# Patient Record
Sex: Male | Born: 1970 | Race: Black or African American | Marital: Married | Smoking: Former smoker
Health system: Southern US, Community
[De-identification: ages and names within clinical notes are randomized; demographics above are authoritative.]

## PROBLEM LIST (undated history)

## (undated) DIAGNOSIS — K8020 Calculus of gallbladder without cholecystitis without obstruction: Secondary | ICD-10-CM

## (undated) HISTORY — PX: OTHER SURGICAL HISTORY: SHX169

## (undated) HISTORY — DX: Calculus of gallbladder without cholecystitis without obstruction: K80.20

---

## 2012-03-22 ENCOUNTER — Emergency Department (HOSPITAL_COMMUNITY): Payer: 59

## 2012-03-22 ENCOUNTER — Encounter (HOSPITAL_COMMUNITY): Payer: Self-pay | Admitting: *Deleted

## 2012-03-22 ENCOUNTER — Emergency Department (HOSPITAL_COMMUNITY)
Admission: EM | Admit: 2012-03-22 | Discharge: 2012-03-22 | Disposition: A | Discharge status: Home / Self Care | Payer: 59 | Attending: Emergency Medicine | Admitting: Emergency Medicine

## 2012-03-22 DIAGNOSIS — R079 Chest pain, unspecified: Secondary | ICD-10-CM | POA: Insufficient documentation

## 2012-03-22 DIAGNOSIS — R05 Cough: Secondary | ICD-10-CM | POA: Insufficient documentation

## 2012-03-22 DIAGNOSIS — H6090 Unspecified otitis externa, unspecified ear: Secondary | ICD-10-CM

## 2012-03-22 DIAGNOSIS — R059 Cough, unspecified: Secondary | ICD-10-CM | POA: Insufficient documentation

## 2012-03-22 DIAGNOSIS — F17200 Nicotine dependence, unspecified, uncomplicated: Secondary | ICD-10-CM | POA: Insufficient documentation

## 2012-03-22 DIAGNOSIS — R0789 Other chest pain: Secondary | ICD-10-CM

## 2012-03-22 DIAGNOSIS — Z720 Tobacco use: Secondary | ICD-10-CM

## 2012-03-22 DIAGNOSIS — J069 Acute upper respiratory infection, unspecified: Secondary | ICD-10-CM

## 2012-03-22 DIAGNOSIS — H60509 Unspecified acute noninfective otitis externa, unspecified ear: Secondary | ICD-10-CM | POA: Insufficient documentation

## 2012-03-22 MED ORDER — OFLOXACIN 0.3 % OT SOLN
5.0000 [drp] | Freq: Two times a day (BID) | OTIC | Status: DC
  Filled 2012-03-22: qty 5

## 2012-03-22 MED ORDER — ALBUTEROL SULFATE (5 MG/ML) 0.5% IN NEBU
5.0000 mg | INHALATION_SOLUTION | RESPIRATORY_TRACT | Status: DC
  Administered 2012-03-22: 5 mg via RESPIRATORY_TRACT
  Filled 2012-03-22: qty 1

## 2012-03-22 MED ORDER — IPRATROPIUM BROMIDE 0.02 % IN SOLN
0.5000 mg | RESPIRATORY_TRACT | Status: DC
  Administered 2012-03-22: 0.5 mg via RESPIRATORY_TRACT
  Filled 2012-03-22: qty 2.5

## 2012-03-22 MED ORDER — OFLOXACIN 0.3 % OP SOLN
5.0000 [drp] | Freq: Two times a day (BID) | OPHTHALMIC | Status: DC
  Administered 2012-03-22: 5 [drp] via OTIC
  Filled 2012-03-22: qty 5

## 2012-03-22 NOTE — ED Notes (Signed)
Pt given d/c teaching and ear drops before d/c. Pt has no further questions upon d/c. Pt ambulatory leaving ED by self. Pt does not appear to be in any acute distress upon d/c.

## 2012-03-22 NOTE — ED Provider Notes (Signed)
History   This chart was scribed for Curtis Skene, MD by [name], ED Scribe. This patient was seen in room TR11C/TR11C and the patient's care was started at 9:52 PM    CSN: [id number]  Arrival date & time 03/22/12  2048   First MD Initiated Contact with Patient 03/22/12 2139      Chief Complaint  Patient presents with  . Cough  . Nasal Congestion     The history is provided by the patient. No language interpreter was used.   Curtis Santiago is a 41 y.o. male who presents to the Emergency Department complaining of constant congestion for past 5 days with associated right side otalgia beginning yesterday and fever.  Pt also reports sharp, stabbing, non-radiating right-side rib pain brought on by, and worsened by, constant non-productive coughs over past 5 days.  Pt denies rash, myalgias, back pain, abdominal pain, nausea, emesis, or urinary symptoms.  Pt is a current everyday smoker but denies alcohol use.   History reviewed. No pertinent past medical history.  History reviewed. No pertinent past surgical history.  History reviewed. No pertinent family history.  History  Substance Use Topics  . Smoking status: Never Smoker   . Smokeless tobacco: Not on file  . Alcohol Use: No      Review of Systems At least 10pt or greater review of systems completed and are negative except where specified in the HPI.  Allergies  Review of patient's allergies indicates no known allergies.  Home Medications  No current outpatient prescriptions on file.  BP 137/80  Pulse 118  Temp 99.7 F (37.6 C) (Oral)  Resp 16  SpO2 97%  Physical Exam  Nursing notes reviewed.  Electronic medical record reviewed. VITAL SIGNS:   Filed Vitals:   03/22/12 2100 03/22/12 2256  BP: 137/80 121/66  Pulse: 118 111  Temp: 99.7 F (37.6 C) 99.2 F (37.3 C)  TempSrc: Oral Oral  Resp: 16 20  SpO2: 97% 96%   CONSTITUTIONAL: Awake, oriented, appears non-toxic HENT: Atraumatic, normocephalic,  oral mucosa pink and moist, airway patent. Nares patent without drainage. External ears normal. Right ear canal inflamed with mild swelling EYES: Conjunctiva clear, EOMI, PERRLA NECK: Trachea midline, non-tender, supple CARDIOVASCULAR: Normal heart rate, Normal rhythm, No murmurs, rubs, gallops PULMONARY/CHEST: Clear to auscultation, no rhonchi, wheezes, or rales. Symmetrical breath sounds. Right sided ribs TTP, worst close to costal angle in the mid-clavicular line ABDOMINAL: Non-distended, soft, non-tender - no rebound or guarding.  BS normal. NEUROLOGIC: Non-focal, moving all four extremities, no gross sensory or motor deficits. EXTREMITIES: No clubbing, cyanosis, or edema SKIN: Warm, Dry, No erythema, No rash  ED Course  Procedures (including critical care time) DIAGNOSTIC STUDIES: Oxygen Saturation is 97% on room air, adequate by my interpretation.    COORDINATION OF CARE: 9:57 PM- Patient informed of clinical course, understands medical decision-making process, and agrees with plan.  Labs Reviewed - No data to display Dg Chest 2 View  03/22/2012  *RADIOLOGY REPORT*  Clinical Data: Nasal congestion  CHEST - 2 VIEW  Comparison: None  Findings: The heart size and mediastinal contours are within normal limits.  Both lungs are clear.  The visualized skeletal structures are unremarkable.  IMPRESSION: No active cardiopulmonary abnormalities.   Original Report Authenticated By: Signa Kell, M.D.      1. Otitis externa   2. URI (upper respiratory infection)   3. Cough   4. Chest wall pain   5. Tobacco use  MDM  Curtis Santiago is a 41 y.o. male presenting with h/o URI, cough, ear pain who continues to smoke.  Pt has chest wall pain secondary to coughing.  Do not think his CP represents any intrathoracic emergency - BS heard BL, don't suspect PTX, ACS, PE.  CXR obtained per protocol - negative. Testing not indicated.  Mild case of otitis externa.  I explained the diagnosis and  have given explicit precautions to return to the ER including any other new or worsening symptoms. The patient understands and accepts the medical plan as it's been dictated and I have answered their questions. Discharge instructions concerning home care and prescriptions have been given.  The patient is STABLE and is discharged to home in good condition.    I personally performed the services described in this documentation, which was scribed in my presence. The recorded information has been reviewed and is accurate.        Curtis Skene, MD 03/24/12 1831

## 2012-03-22 NOTE — ED Notes (Signed)
Pt reports congestions since Thanksgiving.  States that his (R) ear started to hurt yesterday.  Pt reports cough that is nonproductive.  A/O x 4.  NAD.

## 2012-03-22 NOTE — ED Notes (Signed)
Dr. Bonk at bedside 

## 2012-03-22 NOTE — ED Notes (Signed)
Messaged pharmacy for medication to be sent to [facility].

## 2012-03-22 NOTE — ED Notes (Signed)
Pt denies feeling dizzy or nausea; pt denies shortness of breath; pt mentating appropriately.

## 2014-07-05 IMAGING — CR DG CHEST 2V
2 series · 2 of 2 positions shown · non-contrast
Comparison: None

CLINICAL DATA: Nasal congestion

CHEST - 2 VIEW

[w chest pa]
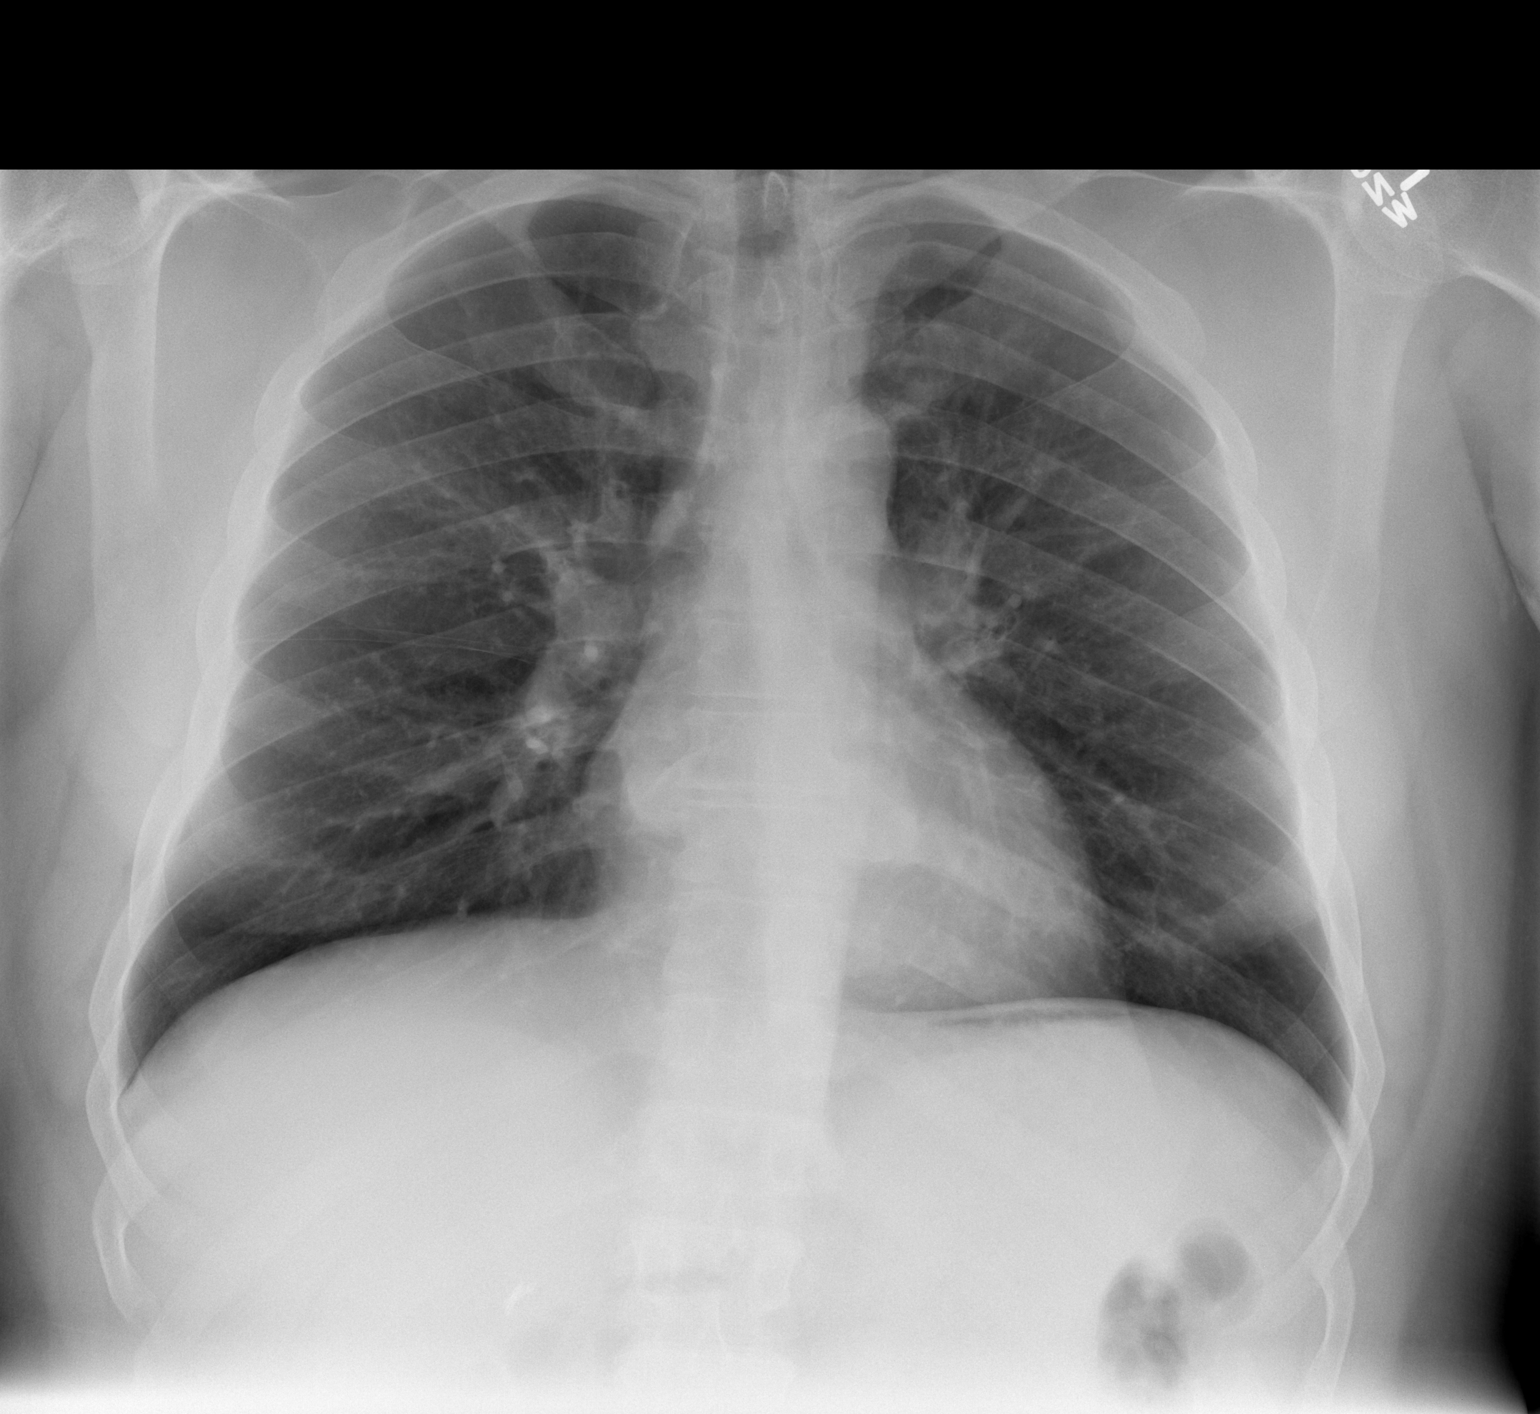

[w chest lat]
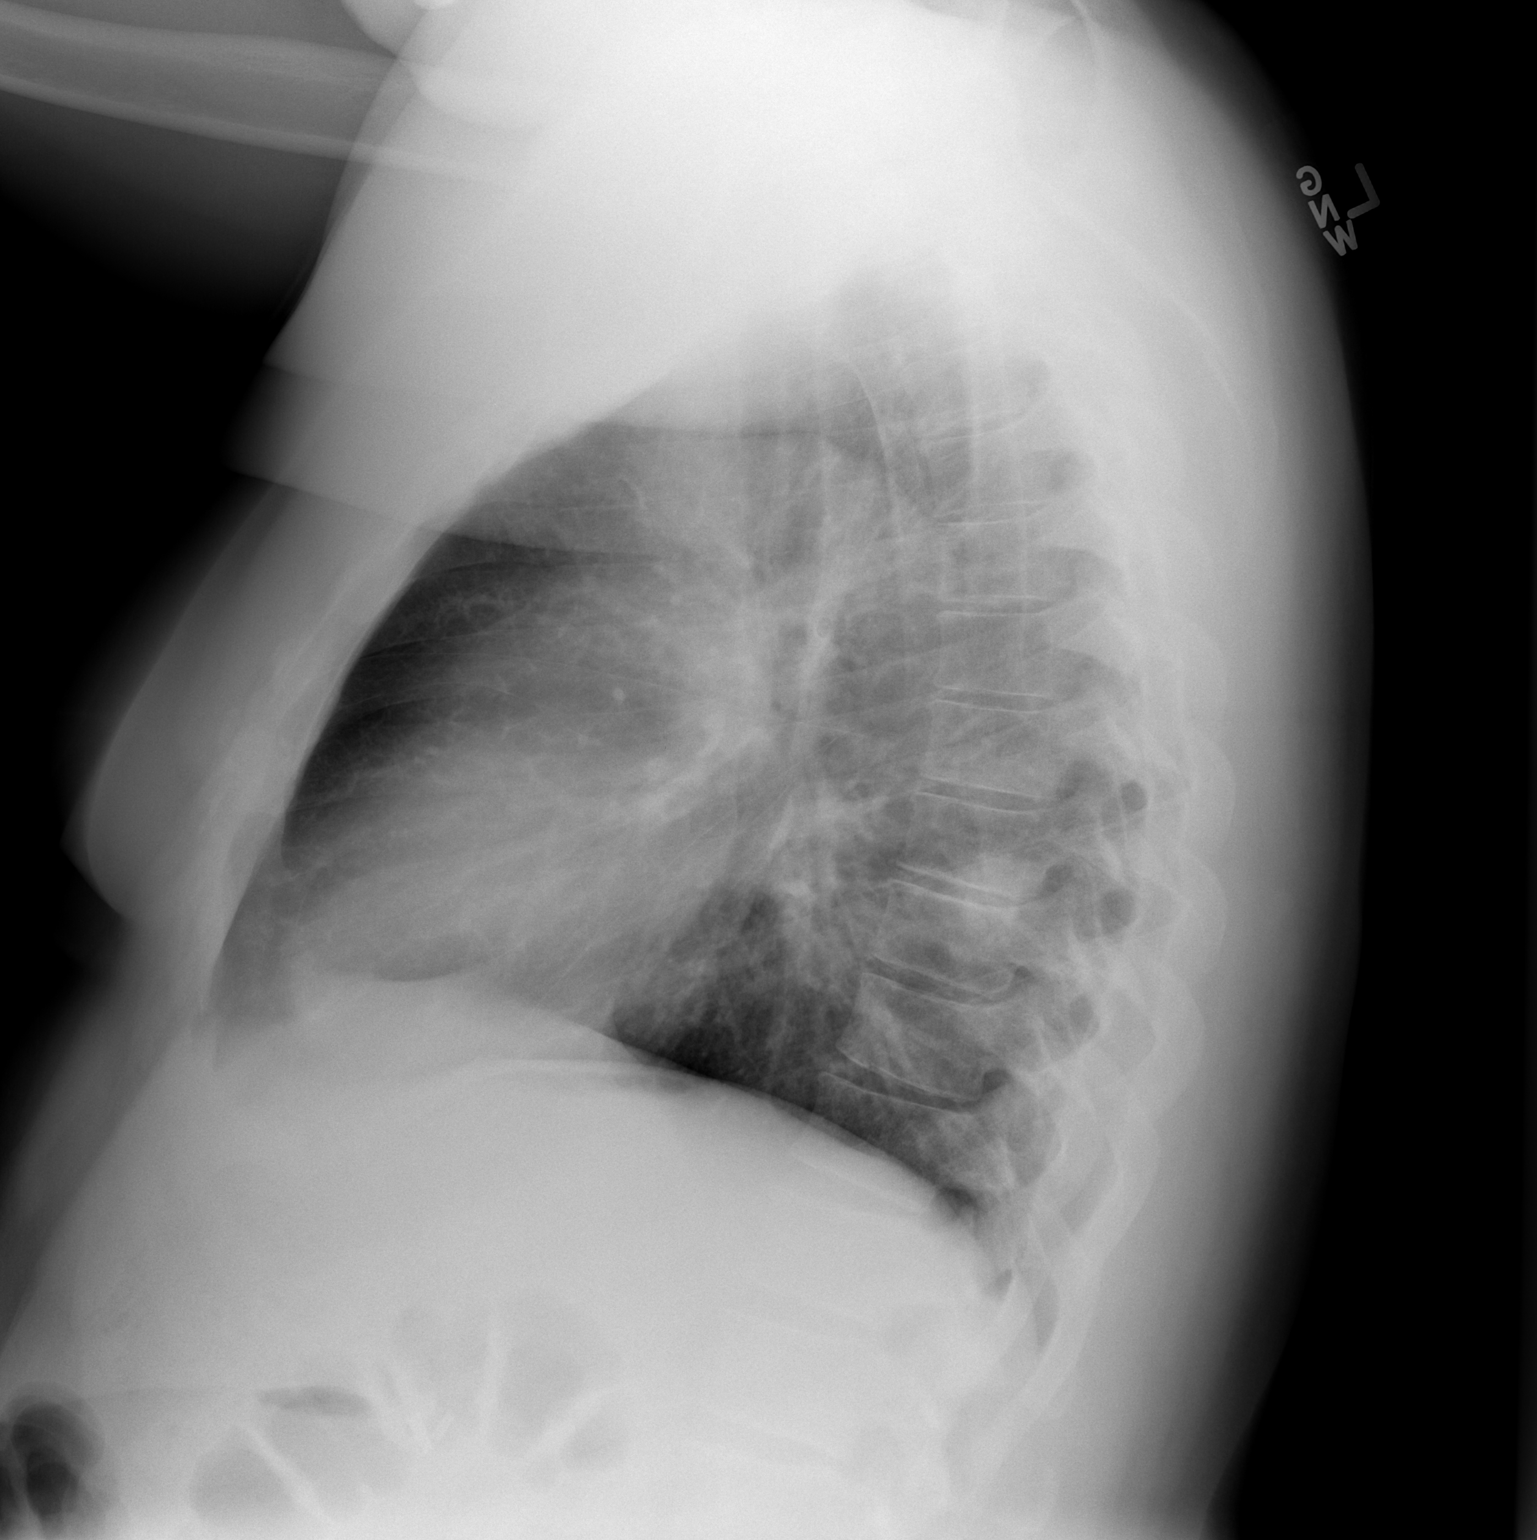

[2 of 2 positions shown; findings below may reference images not displayed]

FINDINGS: The heart size and mediastinal contours are within normal
limits.  Both lungs are clear.  The visualized skeletal structures
are unremarkable.
IMPRESSION: No active cardiopulmonary abnormalities.

## 2022-05-12 ENCOUNTER — Emergency Department (HOSPITAL_BASED_OUTPATIENT_CLINIC_OR_DEPARTMENT_OTHER)
Admission: EM | Admit: 2022-05-12 | Discharge: 2022-05-12 | Disposition: A | Discharge status: Home / Self Care | Payer: Self-pay | Attending: Emergency Medicine | Admitting: Emergency Medicine

## 2022-05-12 ENCOUNTER — Emergency Department (HOSPITAL_BASED_OUTPATIENT_CLINIC_OR_DEPARTMENT_OTHER): Payer: Self-pay

## 2022-05-12 ENCOUNTER — Other Ambulatory Visit: Payer: Self-pay

## 2022-05-12 ENCOUNTER — Encounter (HOSPITAL_BASED_OUTPATIENT_CLINIC_OR_DEPARTMENT_OTHER): Payer: Self-pay

## 2022-05-12 DIAGNOSIS — R112 Nausea with vomiting, unspecified: Secondary | ICD-10-CM | POA: Insufficient documentation

## 2022-05-12 DIAGNOSIS — Z20822 Contact with and (suspected) exposure to covid-19: Secondary | ICD-10-CM | POA: Insufficient documentation

## 2022-05-12 DIAGNOSIS — R519 Headache, unspecified: Secondary | ICD-10-CM | POA: Insufficient documentation

## 2022-05-12 LAB — COMPREHENSIVE METABOLIC PANEL
ALT: 19 U/L (ref 0–44)
AST: 15 U/L (ref 15–41)
Albumin: 4.1 g/dL (ref 3.5–5.0)
Alkaline Phosphatase: 90 U/L (ref 38–126)
Anion gap: 7 (ref 5–15)
BUN: 10 mg/dL (ref 6–20)
CO2: 30 mmol/L (ref 22–32)
Calcium: 9.5 mg/dL (ref 8.9–10.3)
Chloride: 97 mmol/L — ABNORMAL LOW (ref 98–111)
Creatinine, Ser: 1.01 mg/dL (ref 0.61–1.24)
GFR, Estimated: >60 mL/min (ref >60)
Glucose, Bld: 110 mg/dL — ABNORMAL HIGH (ref 70–99)
Potassium: 3.5 mmol/L (ref 3.5–5.1)
Sodium: 134 mmol/L — ABNORMAL LOW (ref 135–145)
Total Bilirubin: 0.2 mg/dL — ABNORMAL LOW (ref 0.3–1.2)
Total Protein: 8.8 g/dL — ABNORMAL HIGH (ref 6.5–8.1)

## 2022-05-12 LAB — CBC
HCT: 45 % (ref 39.0–52.0)
Hemoglobin: 14.1 g/dL (ref 13.0–17.0)
MCH: 25.5 pg — ABNORMAL LOW (ref 26.0–34.0)
MCHC: 31.3 g/dL (ref 30.0–36.0)
MCV: 81.2 fL (ref 80.0–100.0)
Platelets: 468 10*3/uL — ABNORMAL HIGH (ref 150–400)
RBC: 5.54 MIL/uL (ref 4.22–5.81)
RDW: 14.3 % (ref 11.5–15.5)
WBC: 13.3 10*3/uL — ABNORMAL HIGH (ref 4.0–10.5)
nRBC: 0 % (ref 0.0–0.2)

## 2022-05-12 LAB — URINALYSIS, ROUTINE W REFLEX MICROSCOPIC
Bilirubin Urine: NEGATIVE
Glucose, UA: NEGATIVE mg/dL
Ketones, ur: NEGATIVE mg/dL
Leukocytes,Ua: NEGATIVE
Nitrite: NEGATIVE
Protein, ur: NEGATIVE mg/dL
Specific Gravity, Urine: 1.01 (ref 1.005–1.030)
pH: 6 (ref 5.0–8.0)

## 2022-05-12 LAB — RESP PANEL BY RT-PCR (RSV, FLU A&B, COVID)  RVPGX2
Influenza A by PCR: NEGATIVE
Influenza B by PCR: NEGATIVE
Resp Syncytial Virus by PCR: NEGATIVE
SARS Coronavirus 2 by RT PCR: NEGATIVE

## 2022-05-12 LAB — PROTIME-INR
INR: 0.9 (ref 0.8–1.2)
Prothrombin Time: 11.7 seconds (ref 11.4–15.2)

## 2022-05-12 LAB — URINALYSIS, MICROSCOPIC (REFLEX)

## 2022-05-12 LAB — LIPASE, BLOOD: Lipase: 23 U/L (ref 11–51)

## 2022-05-12 MED ORDER — ACETAMINOPHEN 325 MG PO TABS
650.0000 mg | ORAL_TABLET | Freq: Once | ORAL | Status: AC
  Administered 2022-05-12: 650 mg via ORAL
  Filled 2022-05-12: qty 2

## 2022-05-12 MED ORDER — NAPROXEN 375 MG PO TABS: 375.0000 mg | ORAL_TABLET | Freq: Two times a day (BID) | ORAL | 0 refills | Status: DC

## 2022-05-12 MED ORDER — SODIUM CHLORIDE 0.9 % IV BOLUS
1000.0000 mL | Freq: Once | INTRAVENOUS | Status: AC
  Administered 2022-05-12: 1000 mL via INTRAVENOUS

## 2022-05-12 MED ORDER — IOHEXOL 300 MG/ML  SOLN
100.0000 mL | Freq: Once | INTRAMUSCULAR | Status: AC | PRN
  Administered 2022-05-12: 100 mL via INTRAVENOUS

## 2022-05-12 MED ORDER — ONDANSETRON 8 MG PO TBDP: 8.0000 mg | ORAL_TABLET | Freq: Three times a day (TID) | ORAL | 0 refills | Status: DC | PRN

## 2022-05-12 MED ORDER — ONDANSETRON HCL 4 MG/2ML IJ SOLN
4.0000 mg | Freq: Once | INTRAMUSCULAR | Status: AC
  Administered 2022-05-12: 4 mg via INTRAVENOUS
  Filled 2022-05-12: qty 2

## 2022-05-12 NOTE — ED Provider Notes (Signed)
[place] [facility] Provider Note   CSN: [id number] Arrival date & time: 05/12/22  1651     History  Chief Complaint  Patient presents with   Vomiting   Headache    Curtis Santiago is a 52 y.o. male.   Headache  Pt started having nausea and vomiting yesterday.   He thought he noticed some blood after vomiting once but did not see it afterwards.  NO diarrhea.  Tonight he started having pain in his abdomen.  He has been having headaches off and on the last month.  NO acute onset today.  No fever or cough.    Home Medications Prior to Admission medications   Medication Sig Start Date End Date Taking? Authorizing Provider  naproxen (NAPROSYN) 375 MG tablet Take 1 tablet (375 mg total) by mouth 2 (two) times daily. 05/12/22  Yes Dorie Rank, MD  ondansetron (ZOFRAN-ODT) 8 MG disintegrating tablet Take 1 tablet (8 mg total) by mouth every 8 (eight) hours as needed for nausea or vomiting. 05/12/22  Yes Dorie Rank, MD  guaiFENesin (ROBITUSSIN) 100 MG/5ML SOLN Take 5 mLs by mouth every 4 (four) hours as needed. For cough    [provider]      Allergies    Patient has no known allergies.    Review of Systems   Review of Systems  Neurological:  Positive for headaches.    Physical Exam Updated Vital Signs BP 110/68   Pulse 83   Temp 98.5 F (36.9 C) (Oral)   Resp 18   Ht 1.676 m (5\' 6" )   Wt 99.8 kg   SpO2 99%   BMI 35.51 kg/m  Physical Exam Vitals and nursing note reviewed.  Constitutional:      General: He is not in acute distress.    Appearance: He is well-developed.  HENT:     Head: Normocephalic and atraumatic.     Right Ear: External ear normal.     Left Ear: External ear normal.  Eyes:     General: No scleral icterus.       Right eye: No discharge.        Left eye: No discharge.     Conjunctiva/sclera: Conjunctivae normal.  Neck:     Trachea: No tracheal deviation.  Cardiovascular:     Rate and Rhythm:  Normal rate and regular rhythm.  Pulmonary:     Effort: Pulmonary effort is normal. No respiratory distress.     Breath sounds: Normal breath sounds. No stridor. No wheezing or rales.  Abdominal:     General: Bowel sounds are normal. There is no distension.     Palpations: Abdomen is soft.     Tenderness: There is no abdominal tenderness. There is no guarding or rebound.  Musculoskeletal:        General: No tenderness or deformity.     Cervical back: Full passive range of motion without pain and neck supple.  Skin:    General: Skin is warm and dry.     Findings: No rash.  Neurological:     General: No focal deficit present.     Mental Status: He is alert.     Cranial Nerves: No cranial nerve deficit, dysarthria or facial asymmetry.     Sensory: No sensory deficit.     Motor: No abnormal muscle tone or seizure activity.     Coordination: Coordination normal.  Psychiatric:        Mood and Affect: Mood normal.  ED Results / Procedures / Treatments   Labs (all labs ordered are listed, but only abnormal results are displayed) Labs Reviewed  COMPREHENSIVE METABOLIC PANEL - Abnormal; Notable for the following components:      Result Value   Sodium 134 (*)    Chloride 97 (*)    Glucose, Bld 110 (*)    Total Protein 8.8 (*)    Total Bilirubin 0.2 (*)    All other components within normal limits  CBC - Abnormal; Notable for the following components:   WBC 13.3 (*)    MCH 25.5 (*)    Platelets 468 (*)    All other components within normal limits  URINALYSIS, ROUTINE W REFLEX MICROSCOPIC - Abnormal; Notable for the following components:   Hgb urine dipstick SMALL (*)    All other components within normal limits  URINALYSIS, MICROSCOPIC (REFLEX) - Abnormal; Notable for the following components:   Bacteria, UA RARE (*)    All other components within normal limits  RESP PANEL BY RT-PCR (RSV, FLU A&B, COVID)  RVPGX2  LIPASE, BLOOD  PROTIME-INR    EKG None  Radiology CT  ABDOMEN PELVIS W CONTRAST  Result Date: 05/12/2022 CLINICAL DATA:  Acute abdominal pain EXAM: CT ABDOMEN AND PELVIS WITH CONTRAST TECHNIQUE: Multidetector CT imaging of the abdomen and pelvis was performed using the standard protocol following bolus administration of intravenous contrast. RADIATION DOSE REDUCTION: This exam was performed according to the departmental dose-optimization program which includes automated exposure control, adjustment of the mA and/or kV according to patient size and/or use of iterative reconstruction technique. CONTRAST:  152mL OMNIPAQUE IOHEXOL 300 MG/ML  SOLN COMPARISON:  None Available. FINDINGS: Lower chest: No acute abnormality. Hepatobiliary: No focal liver abnormality is seen. Status post cholecystectomy. No biliary dilatation. Pancreas: Unremarkable. No pancreatic ductal dilatation or surrounding inflammatory changes. Spleen: Normal in size without focal abnormality. Adrenals/Urinary Tract: Adrenal glands are unremarkable. Kidneys are normal, without renal calculi, focal lesion, or hydronephrosis. Bladder is unremarkable. Stomach/Bowel: Stomach is within normal limits. Appendix appears normal. No evidence of bowel wall thickening, distention, or inflammatory changes. Vascular/Lymphatic: No significant vascular findings are present. No enlarged abdominal or pelvic lymph nodes. Reproductive: Prostate is unremarkable. Other: No abdominal wall hernia or abnormality. No abdominopelvic ascites. Musculoskeletal: No acute or significant osseous findings. IMPRESSION: 1. No CT evidence of acute abdominal/pelvic process. 2. Status post cholecystectomy. Electronically Signed   By: Ronney Asters M.D.   On: 05/12/2022 21:26    Procedures Procedures    Medications Ordered in ED Medications  sodium chloride 0.9 % bolus 1,000 mL (1,000 mLs Intravenous New Bag/Given 05/12/22 2032)  ondansetron [facility]) injection 4 mg (4 mg Intravenous Given 05/12/22 2031)  acetaminophen (TYLENOL) tablet  650 mg (650 mg Oral Given 05/12/22 2030)  iohexol (OMNIPAQUE) 300 MG/ML solution 100 mL (100 mLs Intravenous Contrast Given 05/12/22 2113)    ED Course/ Medical Decision Making/ A&P Clinical Course as of 05/12/22 2215  Mon May 12, 2022  2148 CT scan without acute abnormality.  Patient is status postcholecystectomy [JK]  2148 Labs reviewed.  CBC and metabolic panel unremarkable with exception of leukocytosis.  Urinalysis without infection.  COVID flu RSV negative [JK]    Clinical Course User Index [JK] Dorie Rank, MD                             Medical Decision Making Problems Addressed: Nausea and vomiting, unspecified vomiting type: acute illness or injury  Nonintractable headache, unspecified chronicity pattern, unspecified headache type: chronic illness or injury with exacerbation, progression, or side effects of treatment  Amount and/or Complexity of Data Reviewed Labs: ordered. Decision-making details documented in ED Course. Radiology: ordered and independent interpretation performed.  Risk OTC drugs. Prescription drug management.   Patient presented to the ED for evaluation of nausea vomiting headaches.  ED workup reassuring.  Leukocytosis noted but otherwise unremarkable.  Metabolic panel without acute abnormalities.  No signs of UTI.  COVID and flu negative.  Patient does not have any findings to suggest meningitis.  No neurologic deficits on exam to suggest stroke.  CT scan was performed and there is no evidence of diverticulitis colitis or obstruction.  Will discharge home with antiemetics.  Possible viral illness.  Over-the-counter medications as needed for headache.  Discussed outpatient follow-up with PCP.        Final Clinical Impression(s) / ED Diagnoses Final diagnoses:  Nausea and vomiting, unspecified vomiting type  Nonintractable headache, unspecified chronicity pattern, unspecified headache type    Rx / DC Orders ED Discharge Orders          Ordered     naproxen (NAPROSYN) 375 MG tablet  2 times daily        05/12/22 2213    ondansetron (ZOFRAN-ODT) 8 MG disintegrating tablet  Every 8 hours PRN        05/12/22 2213              Linwood Dibbles, MD 05/12/22 2215

## 2022-05-12 NOTE — ED Triage Notes (Signed)
C/o nausea/ vomiting since yesterday. Headache x 1 month. States ate lunch today and was nauseous but kept it down. Denies abdominal pain. Last BM yesterday.

## 2022-05-12 NOTE — ED Notes (Signed)
Patient transported to CT 

## 2022-05-12 NOTE — ED Provider Triage Note (Signed)
Emergency Medicine Provider Triage Evaluation Note  Shavar Gorka , a 52 y.o. male  was evaluated in triage.  Pt complains of nausea and vomiting with episode of hematemesis yesterday. Generally feeling unwell since lunch today. Intermittent bilateral frontal headaches x 1 month. Unrelieved with ibuprofen taking daily. Generalized abdominal pain that started while waiting in waiting room, states it is severe. No anticoagulants. Sent from urgent care for blood work and requesting imaging. No fever, chills, cough, congestion, shortness of breath, chest pain, dysuria, bloody stools, or diarrhea.   Review of Systems  Positive: See HPI  Negative: See HPI  Physical Exam  BP (!) 153/100 (BP Location: Left Arm)   Pulse (!) 112   Temp 99.2 F (37.3 C) (Oral)   Resp 20   Ht 5\' 6"  (1.676 m)   Wt 99.8 kg   SpO2 97%   BMI 35.51 kg/m  Gen:   Awake, no distress   Resp:  Normal effort  MSK:   Moves extremities without difficulty  Other:  Diffuse left sided abdominal tenderness, no rebound or guarding  Medical Decision Making  Medically screening exam initiated at 6:49 PM.  Appropriate orders placed.  Kaye Mitro was informed that the remainder of the evaluation will be completed by another provider, this initial triage assessment does not replace that evaluation, and the importance of remaining in the ED until their evaluation is complete.     Turner Daniels 05/12/22 1903

## 2022-05-12 NOTE — Discharge Instructions (Signed)
Take the medications as prescribed to help with the nausea and headache.  Follow-up with your primary care doctor to be rechecked.  Turn as needed for worsening symptoms.

## 2022-11-06 ENCOUNTER — Encounter (HOSPITAL_BASED_OUTPATIENT_CLINIC_OR_DEPARTMENT_OTHER): Payer: Self-pay

## 2022-11-06 ENCOUNTER — Other Ambulatory Visit: Payer: Self-pay

## 2022-11-06 ENCOUNTER — Emergency Department (HOSPITAL_BASED_OUTPATIENT_CLINIC_OR_DEPARTMENT_OTHER)
Admission: EM | Admit: 2022-11-06 | Discharge: 2022-11-06 | Disposition: A | Discharge status: Home / Self Care | Payer: Self-pay | Attending: Emergency Medicine | Admitting: Emergency Medicine

## 2022-11-06 ENCOUNTER — Other Ambulatory Visit (HOSPITAL_BASED_OUTPATIENT_CLINIC_OR_DEPARTMENT_OTHER): Payer: Self-pay

## 2022-11-06 DIAGNOSIS — R1314 Dysphagia, pharyngoesophageal phase: Secondary | ICD-10-CM | POA: Insufficient documentation

## 2022-11-06 DIAGNOSIS — Z87891 Personal history of nicotine dependence: Secondary | ICD-10-CM | POA: Insufficient documentation

## 2022-11-06 DIAGNOSIS — R1319 Other dysphagia: Secondary | ICD-10-CM

## 2022-11-06 MED ORDER — OMEPRAZOLE 20 MG PO CPDR
20.0000 mg | DELAYED_RELEASE_CAPSULE | Freq: Every day | ORAL | 0 refills | Status: DC
  Filled 2022-11-06: qty 30, 30d supply, fill #0

## 2022-11-06 NOTE — ED Notes (Signed)
Discharge paperwork given and verbally understood. 

## 2022-11-06 NOTE — ED Triage Notes (Signed)
States feels like something stuck in throat.  Pain to throat constant.  Onset Sunday

## 2022-11-06 NOTE — Discharge Instructions (Signed)
I would stick with softer foods and you can also take the PPI which I have prescribed.  I have also given you a referral to gastroenterology.  They should contact you to schedule an appointment but have also provided the number in the event that they do not.  You may return to the emergency room for any worsening symptoms including trouble breathing, coughing up food or feel like the food is stuck and would not go down.

## 2022-11-06 NOTE — ED Provider Notes (Signed)
[place] EMERGENCY DEPARTMENT AT [facility] Provider Note   CSN: [id number] Arrival date & time: 11/06/22  1021     History Chief Complaint  Patient presents with   Sore Throat    Curtis Santiago is a 52 y.o. male patient with history of tobacco abuse who presents to the emergency department with sensation that he is unable to swallow solid foods.  This started on Sunday and has been constant with just about any solid food.  He denies any choking or coughing up undigested food and it does eventually pass however it does take some time.  He is able to pass fluids does feel slower than normal.  He denies any weight loss, coughing, shortness of breath, chest pain apart from when swallowing occasionally, fever, chills.  He denies any abdominal pain, nausea, vomiting, diarrhea.   Sore Throat       Home Medications Prior to Admission medications   Medication Sig Start Date End Date Taking? Authorizing Provider  omeprazole (PRILOSEC) 20 MG capsule Take 1 capsule (20 mg total) by mouth daily. 11/06/22  Yes Meredeth Ide, Jalisia Puchalski M, PA-C  guaiFENesin (ROBITUSSIN) 100 MG/5ML SOLN Take 5 mLs by mouth every 4 (four) hours as needed. For cough    [provider]  naproxen (NAPROSYN) 375 MG tablet Take 1 tablet (375 mg total) by mouth 2 (two) times daily. 05/12/22   Linwood Dibbles, MD  ondansetron (ZOFRAN-ODT) 8 MG disintegrating tablet Take 1 tablet (8 mg total) by mouth every 8 (eight) hours as needed for nausea or vomiting. 05/12/22   Linwood Dibbles, MD      Allergies    Patient has no known allergies.    Review of Systems   Review of Systems  All other systems reviewed and are negative.   Physical Exam Updated Vital Signs BP (!) 165/80 (BP Location: Right Arm)   Pulse 90   Temp 98.6 F (37 C)   Resp 18   Ht 5\' 6"  (1.676 m)   Wt 99.8 kg   SpO2 100%   BMI 35.51 kg/m  Physical Exam Vitals and nursing note reviewed.  Constitutional:      Appearance: Normal appearance.   HENT:     Head: Normocephalic and atraumatic.     Mouth/Throat:     Comments: No posterior pharyngeal erythema.  Uvula is midline and normal.  No tonsillar hypertrophy or exudate.  Tongue is normal. Eyes:     General:        Right eye: No discharge.        Left eye: No discharge.     Conjunctiva/sclera: Conjunctivae normal.  Pulmonary:     Effort: Pulmonary effort is normal.  Skin:    General: Skin is warm and dry.     Findings: No rash.  Neurological:     General: No focal deficit present.     Mental Status: He is alert.  Psychiatric:        Mood and Affect: Mood normal.        Behavior: Behavior normal.     ED Results / Procedures / Treatments   Labs (all labs ordered are listed, but only abnormal results are displayed) Labs Reviewed - No data to display  EKG EKG Interpretation Date/Time:  Thursday November 06 2022 10:31:16 EDT Ventricular Rate:  85 PR Interval:  164 QRS Duration:  92 QT Interval:  368 QTC Calculation: 437 R Axis:   91  Text Interpretation: Normal sinus rhythm Rightward axis Nonspecific T wave  abnormality Abnormal ECG No previous ECGs available Confirmed by Margarita Grizzle [phone] on 11/06/2022 10:57:40 AM  Radiology No results found.  Procedures Procedures    Medications Ordered in ED Medications - No data to display  ED Course/ Medical Decision Making/ A&P   {   Click here for ABCD2, HEART and other calculators  Medical Decision Making Curtis Santiago is a 52 y.o. male patient who presents to the emergency department today for further evaluation for dysphagia.  There is obviously concern for possible underlying malignancy given the patient's smoking history.  Patient is able to keep foods and liquids down although painful.  There is no trouble talking, fever, chills.  He is breathing normally.  Vital signs are otherwise unremarkable.  Do not feel that imaging is warranted at this time.  I will prescribe him a PPI and have him follow-up with GI.   Strict return precautions were discussed.  He is safe for discharge.     Final Clinical Impression(s) / ED Diagnoses Final diagnoses:  Esophageal dysphagia    Rx / DC Orders ED Discharge Orders          Ordered    Ambulatory referral to Gastroenterology        11/06/22 1105    omeprazole (PRILOSEC) 20 MG capsule  Daily        11/06/22 1106              Honor Loh Boy[place], [place] 11/06/22 1106    Margarita Grizzle, MD 11/07/22 [phone]

## 2022-11-10 ENCOUNTER — Encounter: Payer: Self-pay | Admitting: Physician Assistant

## 2023-01-23 ENCOUNTER — Ambulatory Visit: Payer: Self-pay | Admitting: Physician Assistant

## 2023-01-23 ENCOUNTER — Other Ambulatory Visit (INDEPENDENT_AMBULATORY_CARE_PROVIDER_SITE_OTHER): Payer: Self-pay

## 2023-01-23 ENCOUNTER — Encounter: Payer: Self-pay | Admitting: Physician Assistant

## 2023-01-23 VITALS — BP 120/84 | HR 85 | Ht 66.0 in | Wt 230.1 lb

## 2023-01-23 DIAGNOSIS — R1319 Other dysphagia: Secondary | ICD-10-CM

## 2023-01-23 DIAGNOSIS — K625 Hemorrhage of anus and rectum: Secondary | ICD-10-CM

## 2023-01-23 DIAGNOSIS — R634 Abnormal weight loss: Secondary | ICD-10-CM

## 2023-01-23 DIAGNOSIS — F17200 Nicotine dependence, unspecified, uncomplicated: Secondary | ICD-10-CM

## 2023-01-23 LAB — CBC WITH DIFFERENTIAL/PLATELET
Basophils Absolute: 0.1 10*3/uL (ref 0.0–0.1)
Basophils Relative: 0.8 % (ref 0.0–3.0)
Eosinophils Absolute: 0.3 10*3/uL (ref 0.0–0.7)
Eosinophils Relative: 3.3 % (ref 0.0–5.0)
HCT: 41.3 % (ref 39.0–52.0)
Hemoglobin: 12.9 g/dL — ABNORMAL LOW (ref 13.0–17.0)
Lymphocytes Relative: 30.9 % (ref 12.0–46.0)
Lymphs Abs: 3.2 10*3/uL (ref 0.7–4.0)
MCHC: 31.3 g/dL (ref 30.0–36.0)
MCV: 80.1 fL (ref 78.0–100.0)
Monocytes Absolute: 0.7 10*3/uL (ref 0.1–1.0)
Monocytes Relative: 6.8 % (ref 3.0–12.0)
Neutro Abs: 6.1 10*3/uL (ref 1.4–7.7)
Neutrophils Relative %: 58.2 % (ref 43.0–77.0)
Platelets: 382 10*3/uL (ref 150.0–400.0)
RBC: 5.15 Mil/uL (ref 4.22–5.81)
RDW: 14.9 % (ref 11.5–15.5)
WBC: 10.4 10*3/uL (ref 4.0–10.5)

## 2023-01-23 LAB — COMPREHENSIVE METABOLIC PANEL
ALT: 15 U/L (ref 0–53)
AST: 13 U/L (ref 0–37)
Albumin: 4 g/dL (ref 3.5–5.2)
Alkaline Phosphatase: 84 U/L (ref 39–117)
BUN: 9 mg/dL (ref 6–23)
CO2: 29 meq/L (ref 19–32)
Calcium: 9.2 mg/dL (ref 8.4–10.5)
Chloride: 103 meq/L (ref 96–112)
Creatinine, Ser: 0.78 mg/dL (ref 0.40–1.50)
GFR: 104.12 mL/min (ref >60.00)
Glucose, Bld: 97 mg/dL (ref 70–99)
Potassium: 3.6 meq/L (ref 3.5–5.1)
Sodium: 138 meq/L (ref 135–145)
Total Bilirubin: 0.2 mg/dL (ref 0.2–1.2)
Total Protein: 7.3 g/dL (ref 6.0–8.3)

## 2023-01-23 LAB — SEDIMENTATION RATE: Sed Rate: 35 mm/h — ABNORMAL HIGH (ref 0–20)

## 2023-01-23 LAB — TSH: TSH: 0.99 u[IU]/mL (ref 0.35–5.50)

## 2023-01-23 MED ORDER — NA SULFATE-K SULFATE-MG SULF 17.5-3.13-1.6 GM/177ML PO SOLN: 1.0000 | Freq: Once | ORAL | 0 refills | Status: AC

## 2023-01-23 MED ORDER — HYDROCORTISONE ACETATE 25 MG RE SUPP: 25.0000 mg | Freq: Two times a day (BID) | RECTAL | 0 refills | Status: DC

## 2023-01-23 MED ORDER — PANTOPRAZOLE SODIUM 40 MG PO TBEC: 40.0000 mg | DELAYED_RELEASE_TABLET | Freq: Every day | ORAL | 3 refills | Status: AC

## 2023-01-23 MED ORDER — HYDROCORTISONE (PERIANAL) 2.5 % EX CREA: 1.0000 | TOPICAL_CREAM | Freq: Two times a day (BID) | CUTANEOUS | 2 refills | Status: AC

## 2023-01-23 NOTE — Progress Notes (Unsigned)
01/23/2023 Curtis Santiago [id number] 01-04-1971  Referring provider: Teressa Lower, PA-C Primary GI doctor: Dr. Meridee Score  ASSESSMENT AND PLAN:     Dysphagia Progressive difficulty swallowing for approximately 1-2 years, with a significant episode in July. No associated pain.  Weight loss of 20 lbs since June.  History of smoking for at least 30 years. -Schedule an endoscopy as soon as possible to rule out malignancy. -Start a medication for reflux and provide patient with dysphagia diet instructions. -Start on Protonix 40 mg once daily  Rectal Bleeding Occasional bright red blood in stool for the past month.  Regular bowel movements every other day. Internal hemorrhoids identified on rectal exam. -Treat internal hemorrhoids. -Schedule a colonoscopy with endoscopy as patient is overdue at the age of 3 with his rectal bleeding and weight loss  I was able to get patient scheduled for endoscopy and colonoscopy with Dr. Adela Lank for 10/11 which is the earliest that he would be able to do either of the procedures.  I will send this note to Dr. Adela Lank as well as Dr. Meridee Score since that who the patient was assigned with but with patient's weight loss and symptoms I do not want to delay endoscopic evaluation.  Headaches Resolved since changing glasses. History of taking ibuprofen daily for six months for headaches. -Stop all NSAIDs  Tobacco use Discussed risks associated with tobacco use and advised to quit. Information given to the patient, discuss with PCP.  General Health Maintenance -Order labs to check CBC for anemia, kidney and liver function, electrolytes, and thyroid function. -Follow-up after endoscopy and colonoscopy.       Patient Care Team: Patient, No Pcp Per as PCP - General (General Practice)  HISTORY OF PRESENT ILLNESS: 52 y.o. male with a past medical history of tobacco use and others listed below presents for evaluation of dysphagia.    01/22//24 CT and pelvis for acute abdominal pain unremarkable 11/06/2022 ER visit for dysphagia, started on PPI and referred here. Discussed the use of AI scribe software for clinical note transcription with the patient, who gave verbal consent to proceed.  [name] is wife, here with him.  The patient reports that the trouble swallowing has been intermittent for about a year or two, but has recently worsened. 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 The patient has been managing the dysphagia by eating lighter foods and has noticed a decrease in appetite and a weight loss of about 20 pounds since June. Denies odynophagia, GERD, nausea, vomiting.  In addition to the dysphagia, the patient reports occasional bright red rectal bleeding over the past month. The patient denies any associated abdominal pain, diarrhea, or constipation, and has regular bowel movements every other day. The patient also reports a history of headaches, which have resolved since getting new glasses. The patient denies any family history of GI cancers.  The patient has a significant smoking history, having smoked for at least 30 years, but reports cutting back to about half a pack a day in the past three months due to work restrictions.  He denies blood thinner use.  He reports NSAID use, use to take ibuprofen daily 6 months, 4 pills twice a day.  He reports ETOH use, socially He denies drug use.    He  reports that he has quit smoking. His smoking use included cigarettes. He has quit using smokeless tobacco. He  reports that he does not currently use alcohol. He reports that he does not use drugs.  Wt Readings from Last 3 Encounters:  01/23/23 230 lb 2 oz (104.4 kg)  11/06/22 220 lb (99.8 kg)  05/12/22 220 lb (99.8 kg)    RELEVANT LABS AND IMAGING:  Results   DIAGNOSTIC Rectal exam: Internal hemorrhoids, no masses,  prostate normal (01/23/2023)      CBC    Component Value Date/Time   WBC 13.3 (H) 05/12/2022 1717   RBC 5.54 05/12/2022 1717   HGB 14.1 05/12/2022 1717   HCT 45.0 05/12/2022 1717   PLT 468 (H) 05/12/2022 1717   MCV 81.2 05/12/2022 1717   MCH 25.5 (L) 05/12/2022 1717   MCHC 31.3 05/12/2022 1717   RDW 14.3 05/12/2022 1717   Recent Labs    05/12/22 1717  HGB 14.1    CMP     Component Value Date/Time   NA 134 (L) 05/12/2022 1717   K 3.5 05/12/2022 1717   CL 97 (L) 05/12/2022 1717   CO2 30 05/12/2022 1717   GLUCOSE 110 (H) 05/12/2022 1717   BUN 10 05/12/2022 1717   CREATININE 1.01 05/12/2022 1717   CALCIUM 9.5 05/12/2022 1717   PROT 8.8 (H) 05/12/2022 1717   ALBUMIN 4.1 05/12/2022 1717   AST 15 05/12/2022 1717   ALT 19 05/12/2022 1717   ALKPHOS 90 05/12/2022 1717   BILITOT 0.2 (L) 05/12/2022 1717   GFRNONAA >60 05/12/2022 1717      Latest Ref Rng & Units 05/12/2022    5:17 PM  Hepatic Function  Total Protein 6.5 - 8.1 g/dL 8.8   Albumin 3.5 - 5.0 g/dL 4.1   AST 15 - 41 U/L 15   ALT 0 - 44 U/L 19   Alk Phosphatase 38 - 126 U/L 90   Total Bilirubin 0.3 - 1.2 mg/dL 0.2       Current Medications:      Current Outpatient Medications (Analgesics):    ibuprofen (ADVIL) 200 MG tablet, Take 200 mg by mouth every 6 (six) hours as needed.   Current Outpatient Medications (Other):    hydrocortisone (ANUSOL-HC) 2.5 % rectal cream, Place 1 Application rectally 2 (two) times daily.   hydrocortisone (ANUSOL-HC) 25 MG suppository, Place 1 suppository (25 mg total) rectally 2 (two) times daily.   pantoprazole (PROTONIX) 40 MG tablet, Take 1 tablet (40 mg total) by mouth daily.   Medical History:  Past Medical History:  Diagnosis Date   Gallstones    Allergies: No Known Allergies   Surgical History:  He  has a past surgical history that includes gallstone removal. Family History:  His family history is not on file.  REVIEW OF SYSTEMS  : All other systems  reviewed and negative except where noted in the History of Present Illness.  PHYSICAL EXAM: BP 120/84 (BP Location: Left Arm, Patient Position: Sitting, Cuff Size: Large)   Pulse 85   Ht 5\' 6"  (1.676 m)   Wt 230 lb 2 oz (104.4 kg)   BMI 37.14 kg/m  General Appearance: Well nourished, in no apparent distress. Head:   Normocephalic and atraumatic. Eyes:  sclerae anicteric,conjunctive pink  Respiratory: Respiratory effort normal, BS equal bilaterally without rales, rhonchi, wheezing. Cardio: RRR with no MRGs. Peripheral pulses intact.  Abdomen: Soft,  Obese ,active bowel sounds.  No tenderness . Marland Kitchen No masses. Rectal: Normal external rectal exam, normal rectal tone, appreciated internal hemorrhoids, non-tender, no masses, , brown stool, hemoccult Negative Musculoskeletal: Full ROM,  Normal gait. Without edema. Skin:  Dry and intact without significant lesions or rashes Neuro: Alert and  oriented x4;  No focal deficits. Psych:  Cooperative. Normal mood and affect.     Doree Albee, PA-C 2:54 PM

## 2023-01-23 NOTE — Progress Notes (Signed)
Attending Physician's Attestation   I have reviewed the chart.   I agree with the Advanced Practitioner's note, impression, and recommendations with any updates as below. As I have not met this patient previously, and Dr. Adela Lank will be performing full endoscopic evaluation, he may continue to monitor the patient in the future versus I can take over again.  That can be discussed after his endoscopic evaluation.   Corliss Parish, MD [facility] Advanced Endoscopy Office # [phone]

## 2023-01-23 NOTE — Patient Instructions (Addendum)
Your provider has requested that you go to the basement level for lab work before leaving today. Press "B" on the elevator. The lab is located at the first door on the left as you exit the elevator.  You have been scheduled for an endoscopy and colonoscopy. Please follow the written instructions given to you at your visit today.  Please pick up your prep supplies at the pharmacy within the next 1-3 days.  If you use inhalers (even only as needed), please bring them with you on the day of your procedure.  DO NOT TAKE 7 DAYS PRIOR TO TEST- Trulicity (dulaglutide) Ozempic, Wegovy (semaglutide) Mounjaro (tirzepatide) Bydureon Bcise (exanatide extended release)  DO NOT TAKE 1 DAY PRIOR TO YOUR TEST Rybelsus (semaglutide) Adlyxin (lixisenatide) Victoza (liraglutide) Byetta (exanatide) ___________________________________________________________________________    Dysphagia precautions:  1. Take reflux medications 30+ minutes before food in the morning 2. Begin meals with warm beverage 3. Eat smaller more frequent meals 4. Eat slowly, taking small bites and sips 5. Alternate solids and liquids 6. Avoid foods/liquids that increase acid production 7. Sit upright during and for 30+ minutes after meals to facilitate esophageal clearing 8. All meats should be chopped finely.   If something gets hung in your esophagus and will not come up or go down, proceed to the emergency room.    Please do sitz baths- these can be found at the pharmacy. It is a Chief Operating Officer that is put in your toliet.  Please increase fiber or add benefiber, increase water and increase acitivity.  Will send in hydrocoritsone suppository, cheapest with GOODRX from [employer], [employer], [employer] or [employer] if your insurance does not pay for it. If the hemorrhoid suppository sent in is too expensive you can do this over the counter trick.  Apply a pea size amount of generic prescription Anusol HC cream that has been sent  into your pharmacy to the tip of an over the counter PrepH suppository and insert rectally once every night for at least 7 nights.  If this does not improve there are procedures that can be done.   About Hemorrhoids  Hemorrhoids are swollen veins in the lower rectum and anus.  Also called piles, hemorrhoids are a common problem.  Hemorrhoids may be internal (inside the rectum) or external (around the anus).  Internal Hemorrhoids  Internal hemorrhoids are often painless, but they rarely cause bleeding.  The internal veins may stretch and fall down (prolapse) through the anus to the outside of the body.  The veins may then become irritated and painful.  External Hemorrhoids  External hemorrhoids can be easily seen or felt around the anal opening.  They are under the skin around the anus.  When the swollen veins are scratched or broken by straining, rubbing or wiping they sometimes bleed.  How Hemorrhoids Occur  Veins in the rectum and around the anus tend to swell under pressure.  Hemorrhoids can result from increased pressure in the veins of your anus or rectum.  Some sources of pressure are:  Straining to have a bowel movement because of constipation Waiting too long to have a bowel movement Coughing and sneezing often Sitting for extended periods of time, including on the toilet Diarrhea Obesity Trauma or injury to the anus Some liver diseases Stress Family history of hemorrhoids Pregnancy  Pregnant women should try to avoid becoming constipated, because they are more likely to have hemorrhoids during pregnancy.  In the last trimester of pregnancy, the enlarged uterus may press on  blood vessels and causes hemorrhoids.  In addition, the strain of childbirth sometimes causes hemorrhoids after the birth.  Symptoms of Hemorrhoids  Some symptoms of hemorrhoids include: Swelling and/or a tender lump around the anus Itching, mild burning and bleeding around the anus Painful bowel  movements with or without constipation Bright red blood covering the stool, on toilet paper or in the toilet bowel.   Symptoms usually go away within a few days.  Always talk to your doctor about any bleeding to make sure it is not from some other causes.  Diagnosing and Treating Hemorrhoids  Diagnosis is made by an examination by your healthcare provider.  Special test can be performed by your doctor.    Most cases of hemorrhoids can be treated with: High-fiber diet: Eat more high-fiber foods, which help prevent constipation.  Ask for more detailed fiber information on types and sources of fiber from your healthcare provider. Fluids: Drink plenty of water.  This helps soften bowel movements so they are easier to pass. Sitz baths and cold packs: Sitting in lukewarm water two or three times a day for 15 minutes cleases the anal area and may relieve discomfort.  If the water is too hot, swelling around the anus will get worse.  Placing a cloth-covered ice pack on the anus for ten minutes four times a day can also help reduce selling.  Gently pushing a prolapsed hemorrhoid back inside after the bath or ice pack can be helpful. Medications: For mild discomfort, your healthcare provider may suggest over-the-counter pain medication or prescribe a cream or ointment for topical use.  The cream may contain witch hazel, zinc oxide or petroleum jelly.  Medicated suppositories are also a treatment option.  Always consult your doctor before applying medications or creams. Procedures and surgeries: There are also a number of procedures and surgeries to shrink or remove hemorrhoids in more serious cases.  Talk to your physician about these options.  You can often prevent hemorrhoids or keep them from becoming worse by maintaining a healthy lifestyle.  Eat a fiber-rich diet of fruits, vegetables and whole grains.  Also, drink plenty of water and exercise regularly.   2007, [employer]  I  appreciate the opportunity to care for you. Quentin Mulling, PA-C

## 2023-01-25 ENCOUNTER — Encounter: Payer: Self-pay | Admitting: Certified Registered Nurse Anesthetist

## 2023-01-26 ENCOUNTER — Other Ambulatory Visit: Payer: Self-pay

## 2023-01-26 DIAGNOSIS — K625 Hemorrhage of anus and rectum: Secondary | ICD-10-CM

## 2023-01-26 NOTE — Progress Notes (Signed)
Agree with assessment and plan as outlined.  

## 2023-01-27 LAB — FERRITIN: Ferritin: 138.4 ng/mL (ref 22.0–322.0)

## 2023-01-27 LAB — IRON: Iron: 99 ug/dL (ref 42–165)

## 2023-01-29 ENCOUNTER — Encounter: Payer: Self-pay | Admitting: Gastroenterology

## 2023-01-29 ENCOUNTER — Ambulatory Visit (AMBULATORY_SURGERY_CENTER): Payer: Self-pay | Admitting: Gastroenterology

## 2023-01-29 VITALS — BP 117/80 | HR 88 | Temp 98.6°F | Resp 19 | Ht 66.0 in | Wt 230.0 lb

## 2023-01-29 DIAGNOSIS — K317 Polyp of stomach and duodenum: Secondary | ICD-10-CM

## 2023-01-29 DIAGNOSIS — R634 Abnormal weight loss: Secondary | ICD-10-CM

## 2023-01-29 DIAGNOSIS — R1319 Other dysphagia: Secondary | ICD-10-CM

## 2023-01-29 DIAGNOSIS — K625 Hemorrhage of anus and rectum: Secondary | ICD-10-CM

## 2023-01-29 DIAGNOSIS — K640 First degree hemorrhoids: Secondary | ICD-10-CM

## 2023-01-29 DIAGNOSIS — K319 Disease of stomach and duodenum, unspecified: Secondary | ICD-10-CM

## 2023-01-29 MED ORDER — SODIUM CHLORIDE 0.9 % IV SOLN: 500.0000 mL | Freq: Once | INTRAVENOUS | Status: DC

## 2023-01-29 MED ORDER — CALMOL-4 76-10 % RE SUPP: 1.0000 | Freq: Two times a day (BID) | RECTAL | 0 refills | Status: AC | PRN

## 2023-01-29 NOTE — Progress Notes (Signed)
0927  HR > 100 with esmolol 25 mg given IV, MD updated, vss

## 2023-01-29 NOTE — Progress Notes (Signed)
0824 Robinul 0.1 mg IV given due large amount of secretions upon assessment.  MD made aware, vss

## 2023-01-29 NOTE — Progress Notes (Signed)
History and Physical Interval Note:  Patient seen in the office last week. Ongoing intermittent dysphagia to solids. Also has some weight loss in recent months, not trying to do so. Additionally has had intermittent rectal bleeding. History of tobacco use. No prior EGD or colonoscopy. I have discussed the procedures with him, risks / benefits, and he wishes to proceed. Of note, he did drink some water at 715 this AM, so his case start has been delayed until 915. He understands this, further recommendations pending the results.    01/29/2023 8:34 AM  Curtis Santiago  has presented today for endoscopic procedure(s), with the diagnosis of  Encounter Diagnoses  Name Primary?   Esophageal dysphagia Yes   Rectal bleeding    Loss of weight   .  The various methods of evaluation and treatment have been discussed with the patient and/or family. After consideration of risks, benefits and other options for treatment, the patient has consented to  the endoscopic procedure(s).   The patient's history has been reviewed, patient examined, no change in status, stable for surgery.  I have reviewed the patient's chart and labs.  Questions were answered to the patient's satisfaction.    Harlin Rain, MD [facility]

## 2023-01-29 NOTE — Progress Notes (Signed)
Called to room to assist during endoscopic procedure.  Patient ID and intended procedure confirmed with present staff. Received instructions for my participation in the procedure from the performing physician.  

## 2023-01-29 NOTE — Patient Instructions (Addendum)
Continue present medications.  Post esophageal diet (Instructions and handouts provided).  Await pathology results.    Daily fiber supplement to keep stools soft.YOU HAD AN ENDOSCOPIC PROCEDURE TODAY AT THE [facility]:   Refer to the procedure report that was given to you for any specific questions about what was found during the examination.  If the procedure report does not answer your questions, please call your gastroenterologist to clarify.  If you requested that your care partner not be given the details of your procedure findings, then the procedure report has been included in a sealed envelope for you to review at your convenience later.  YOU SHOULD EXPECT: Some feelings of bloating in the abdomen. Passage of more gas than usual.  Walking can help get rid of the air that was put into your GI tract during the procedure and reduce the bloating. If you had a lower endoscopy (such as a colonoscopy or flexible sigmoidoscopy) you may notice spotting of blood in your stool or on the toilet paper. If you underwent a bowel prep for your procedure, you may not have a normal bowel movement for a few days.  Please Note:  You might notice some irritation and congestion in your nose or some drainage.  This is from the oxygen used during your procedure.  There is no need for concern and it should clear up in a day or so.  SYMPTOMS TO REPORT IMMEDIATELY:  Following lower endoscopy (colonoscopy or flexible sigmoidoscopy):  Excessive amounts of blood in the stool  Significant tenderness or worsening of abdominal pains  Swelling of the abdomen that is new, acute  Fever of 100F or higher  Following upper endoscopy (EGD)  Vomiting of blood or coffee ground material  New chest pain or pain under the shoulder blades  Painful or persistently difficult swallowing  New shortness of breath  Fever of 100F or higher  Black, tarry-looking stools  For urgent or emergent issues, a  gastroenterologist can be reached at any hour by calling [phone]. Do not use MyChart messaging for urgent concerns.    DIET:  Post dilation diet.  Drink plenty of fluids but you should avoid alcoholic beverages for 24 hours.  ACTIVITY:  You should plan to take it easy for the rest of today and you should NOT DRIVE or use heavy machinery until tomorrow (because of the sedation medicines used during the test).    FOLLOW UP: Our staff will call the number listed on your records the next business day following your procedure.  We will call around 7:15- 8:00 am to check on you and address any questions or concerns that you may have regarding the information given to you following your procedure. If we do not reach you, we will leave a message.     If any biopsies were taken you will be contacted by phone or by letter within the next 1-3 weeks.  Please call us at [phone] if you have not heard about the biopsies in 3 weeks.    SIGNATURES/CONFIDENTIALITY: You and/or your care partner have signed paperwork which will be entered into your electronic medical record.  These signatures attest to the fact that that the information above on your After Visit Summary has been reviewed and is understood.  Full responsibility of the confidentiality of this discharge information lies with you and/or your care-partner.

## 2023-01-29 NOTE — Op Note (Signed)
[facility] Patient Name: Curtis Santiago Procedure Date: 01/29/2023 9:11 AM MRN: [id number] Endoscopist: Viviann Spare P. Adela Lank , MD, [id number] Age: 52 Referring MD:  Date of Birth: 07/20/1970 Gender: Male Account #: [id number] Procedure:                Colonoscopy Indications:              Rectal bleeding, weight loss - first colonoscopy Medicines:                Monitored Anesthesia Care Procedure:                Pre-Anesthesia Assessment:                           - Prior to the procedure, a History and Physical                            was performed, and patient medications and                            allergies were reviewed. The patient's tolerance of                            previous anesthesia was also reviewed. The risks                            and benefits of the procedure and the sedation                            options and risks were discussed with the patient.                            All questions were answered, and informed consent                            was obtained. Prior Anticoagulants: The patient has                            taken no anticoagulant or antiplatelet agents. ASA                            Grade Assessment: II - A patient with mild systemic                            disease. After reviewing the risks and benefits,                            the patient was deemed in satisfactory condition to                            undergo the procedure.                           After obtaining informed consent, the colonoscope  was passed under direct vision. Throughout the                            procedure, the patient's blood pressure, pulse, and                            oxygen saturations were monitored continuously. The                            Olympus Scope SN: [id number] was introduced through                            the anus and advanced to the the terminal ileum,                            with  identification of the appendiceal orifice and                            IC valve. The colonoscopy was performed without                            difficulty. The patient tolerated the procedure                            well. The quality of the bowel preparation was                            good. The terminal ileum, ileocecal valve,                            appendiceal orifice, and rectum were photographed. Scope In: 9:27:11 AM Scope Out: 9:42:36 AM Scope Withdrawal Time: 0 hours 12 minutes 36 seconds  Total Procedure Duration: 0 hours 15 minutes 25 seconds  Findings:                 The perianal and digital rectal examinations were                            normal.                           The terminal ileum appeared normal.                           Internal hemorrhoids were found during retroflexion.                           The colon was long and redundant. The exam was                            otherwise without abnormality. Complications:            No immediate complications. Estimated blood loss:  None. Estimated Blood Loss:     Estimated blood loss: none. Impression:               - The examined portion of the ileum was normal.                           - Internal hemorrhoids.                           - Long and redundant colon.                           - The examination was otherwise normal.                           Bleeding is coming from hemorrhoids, no other                            concerning pathology noted. Recommendation:           - Patient has a contact number available for                            emergencies. The signs and symptoms of potential                            delayed complications were discussed with the                            patient. Return to normal activities tomorrow.                            Written discharge instructions were provided to the                            patient.                            - Resume previous diet.                           - Continue present medications.                           - Daily fiber supplement to keep stools soft.                            Calmol4 suppositories as needed.                           - If bleeding persists consideration for hemorrhoid                            banding.                           - Repeat colonoscopy in 10 years for screening  purposes. Viviann Spare P. Symantha Steeber, MD 01/29/2023 9:49:40 AM This report has been signed electronically.

## 2023-01-29 NOTE — Progress Notes (Signed)
0925 HR > 100 with esmolol 25 mg given IV, MD updated, vss

## 2023-01-29 NOTE — Progress Notes (Signed)
5621  Pt experienced laryngeal spasm with jaw thrust  performed. Nasopharyngeal airway 7.0 size   placed without trauma.

## 2023-01-29 NOTE — Op Note (Signed)
[facility] Patient Name: Curtis Santiago Procedure Date: 01/29/2023 9:12 AM MRN: [id number] Endoscopist: Viviann Spare P. Adela Lank , MD, [id number] Age: 52 Referring MD:  Date of Birth: [birth year] Gender: Male Account #: [id number] Procedure:                Upper GI endoscopy Indications:              Dysphagia, Weight loss - CT scan without concerning                            pathology Medicines:                Monitored Anesthesia Care Procedure:                Pre-Anesthesia Assessment:                           - Prior to the procedure, a History and Physical                            was performed, and patient medications and                            allergies were reviewed. The patient's tolerance of                            previous anesthesia was also reviewed. The risks                            and benefits of the procedure and the sedation                            options and risks were discussed with the patient.                            All questions were answered, and informed consent                            was obtained. Prior Anticoagulants: The patient has                            taken no anticoagulant or antiplatelet agents. ASA                            Grade Assessment: II - A patient with mild systemic                            disease. After reviewing the risks and benefits,                            the patient was deemed in satisfactory condition to                            undergo the procedure.  After obtaining informed consent, the endoscope was                            passed under direct vision. Throughout the                            procedure, the patient's blood pressure, pulse, and                            oxygen saturations were monitored continuously. The                            Olympus Scope [id number] was introduced through the                            mouth, and advanced to the  second part of duodenum.                            The upper GI endoscopy was accomplished without                            difficulty. The patient tolerated the procedure                            well. Scope In: Scope Out: Findings:                 Esophagogastric landmarks were identified: the                            Z-line was found at 42 cm, the gastroesophageal                            junction was found at 42 cm and the upper extent of                            the gastric folds was found at 42 cm from the                            incisors.                           The exam of the esophagus was otherwise normal. No                            focal stricture, stenosis, or inflammatory changes                            appreciated.                           A guidewire was placed and the scope was withdrawn.                            Empiric dilation was performed in  the entire                            esophagus with a Savary dilator with mild                            resistance at 17 mm. Relook endoscopy showed no                            mucosal wrents.                           A single 3 mm sessile polyp was found in the                            gastric fundus, with a whitish coloration. Suspect                            Xanthoma, but the polyp was removed with a cold                            biopsy forceps to ensure no adenomatous change.                            Resection and retrieval were complete.                           The exam of the stomach was otherwise normal.                           Biopsies were taken with a cold forceps for                            Helicobacter pylori testing.                           The examined duodenum was normal. Complications:            No immediate complications. Estimated blood loss:                            Minimal. Estimated Blood Loss:     Estimated blood loss was minimal. Impression:                - Esophagogastric landmarks identified.                           - Normal esophagus otherwise - empiric dilation                            performed to 17mm.                           - A single gastric polyp. Resected and retrieved.                           -  Normal stomach otherwise - biopsies taken to rule                            out H pylori                           - Normal examined duodenum. Recommendation:           - Patient has a contact number available for                            emergencies. The signs and symptoms of potential                            delayed complications were discussed with the                            patient. Return to normal activities tomorrow.                            Written discharge instructions were provided to the                            patient.                           - Resume previous diet.                           - Continue present medications.                           - Await pathology results and course post dilation Naryah Clenney P. Jerame Hedding, MD 01/29/2023 9:54:12 AM This report has been signed electronically.

## 2023-01-30 ENCOUNTER — Telehealth: Payer: Self-pay | Admitting: *Deleted

## 2023-01-30 NOTE — Telephone Encounter (Signed)
  Follow up Call-     01/29/2023    7:57 AM  Call back number  Post procedure Call Back phone  # [phone]  Permission to leave phone message Yes  No answer, machine full

## 2023-02-02 LAB — SURGICAL PATHOLOGY

## 2023-02-06 ENCOUNTER — Encounter: Payer: Self-pay | Admitting: Gastroenterology

## 2023-10-28 ENCOUNTER — Emergency Department (HOSPITAL_COMMUNITY)
Admission: EM | Admit: 2023-10-28 | Discharge: 2023-10-29 | Disposition: A | Discharge status: Home / Self Care | Payer: Self-pay | Attending: Emergency Medicine | Admitting: Emergency Medicine

## 2023-10-28 ENCOUNTER — Other Ambulatory Visit: Payer: Self-pay

## 2023-10-28 ENCOUNTER — Emergency Department (HOSPITAL_COMMUNITY): Payer: Self-pay

## 2023-10-28 ENCOUNTER — Encounter (HOSPITAL_COMMUNITY): Payer: Self-pay | Admitting: *Deleted

## 2023-10-28 DIAGNOSIS — D72829 Elevated white blood cell count, unspecified: Secondary | ICD-10-CM | POA: Insufficient documentation

## 2023-10-28 DIAGNOSIS — Z23 Encounter for immunization: Secondary | ICD-10-CM | POA: Insufficient documentation

## 2023-10-28 DIAGNOSIS — R55 Syncope and collapse: Secondary | ICD-10-CM | POA: Insufficient documentation

## 2023-10-28 LAB — TROPONIN I (HIGH SENSITIVITY)
Troponin I (High Sensitivity): 9 ng/L (ref <18)
Troponin I (High Sensitivity): 9 ng/L (ref <18)

## 2023-10-28 LAB — BASIC METABOLIC PANEL WITH GFR
Anion gap: 14 (ref 5–15)
BUN: 17 mg/dL (ref 6–20)
CO2: 23 mmol/L (ref 22–32)
Calcium: 9.7 mg/dL (ref 8.9–10.3)
Chloride: 103 mmol/L (ref 98–111)
Creatinine, Ser: 1.14 mg/dL (ref 0.61–1.24)
GFR, Estimated: >60 mL/min (ref >60)
Glucose, Bld: 109 mg/dL — ABNORMAL HIGH (ref 70–99)
Potassium: 3.5 mmol/L (ref 3.5–5.1)
Sodium: 140 mmol/L (ref 135–145)

## 2023-10-28 LAB — CBC
HCT: 42.5 % (ref 39.0–52.0)
Hemoglobin: 13.4 g/dL (ref 13.0–17.0)
MCH: 25.7 pg — ABNORMAL LOW (ref 26.0–34.0)
MCHC: 31.5 g/dL (ref 30.0–36.0)
MCV: 81.6 fL (ref 80.0–100.0)
Platelets: 408 K/uL — ABNORMAL HIGH (ref 150–400)
RBC: 5.21 MIL/uL (ref 4.22–5.81)
RDW: 14.5 % (ref 11.5–15.5)
WBC: 12.6 K/uL — ABNORMAL HIGH (ref 4.0–10.5)
nRBC: 0 % (ref 0.0–0.2)

## 2023-10-28 MED ORDER — SODIUM CHLORIDE 0.9 % IV BOLUS
500.0000 mL | Freq: Once | INTRAVENOUS | Status: AC
  Administered 2023-10-28: 500 mL via INTRAVENOUS

## 2023-10-28 MED ORDER — TETANUS-DIPHTH-ACELL PERTUSSIS 5-2.5-18.5 LF-MCG/0.5 IM SUSY
0.5000 mL | PREFILLED_SYRINGE | Freq: Once | INTRAMUSCULAR | Status: AC
  Administered 2023-10-28: 0.5 mL via INTRAMUSCULAR
  Filled 2023-10-28: qty 0.5

## 2023-10-28 NOTE — ED Provider Triage Note (Signed)
 Emergency Medicine Provider Triage Evaluation Note  Curtis Santiago , a 53 y.o. male  was evaluated in triage.  Pt complains of syncope. Reports he was outside today mowing the yard and had 3 syncopal episodes back to back. Denies any memory of these events. Denies any prodromal symptoms. Denies history of similar. Denies chest pain or shortness of breath. Did hit his head, endorses a headache.  He is not anticoagulated. Does have a small wound to his right scalp area that is not bleeding.   Review of Systems  Positive:  Negative:   Physical Exam  BP 119/85 (BP Location: Right Arm)   Pulse (!) 106   Temp 98 F (36.7 C)   Resp 16   Ht 5' 6 (1.676 m)   Wt 104.3 kg   SpO2 100%   BMI 37.11 kg/m  Gen:   Awake, no distress   Resp:  Normal effort  MSK:   Moves extremities without difficulty  Other:    Medical Decision Making  Medically screening exam initiated at 7:15 PM.  Appropriate orders placed.  Curtis Santiago was informed that the remainder of the evaluation will be completed by another provider, this initial triage assessment does not replace that evaluation, and the importance of remaining in the ED until their evaluation is complete.     Curtis Santiago 10/28/23 [phone]

## 2023-10-28 NOTE — ED Triage Notes (Signed)
 The pt struck his head and he was alone  some dizziness whenever he woke up he was dizzy and still is lac to the scalp

## 2023-10-28 NOTE — ED Triage Notes (Cosign Needed)
 The fall occurred this am 1100am

## 2023-10-28 NOTE — ED Triage Notes (Signed)
 Pt  here from home with c/o syncopal episode after mowing grass fell on the deck hitting his head , has a lack to the right side of his head also some neck pain

## 2023-10-29 LAB — D-DIMER, QUANTITATIVE: D-Dimer, Quant: 0.27 ug{FEU}/mL (ref 0.00–0.50)

## 2023-10-29 LAB — CK: Total CK: 743 U/L — ABNORMAL HIGH (ref 49–397)

## 2023-10-29 NOTE — ED Provider Notes (Signed)
 [place] EMERGENCY DEPARTMENT AT [facility] Provider Note   CSN: [id number] Arrival date & time: 10/28/23  8166     Patient presents with: Loss of Consciousness   Curtis Santiago is a 53 y.o. male.  With noncontributory past medical history reports to emergency room after syncopal episode.  Approximately 12 hours prior to coming to emergency room patient had syncopal episode.  He notes that prior to this he was outside working for extended period of time.  He started to feel lightheaded and went inside to rest.  He continued to feel lightheaded but walked back outside to grab his phone at which point he had syncopal episode he did hit his head and lost consciousness for a few minutes.  He stood up again and passed out again x2. No focal deficits. No seizure like activity occurred. He reports he was not drinking a lot of water nor had he eaten before this happen.  Denies any specific chest pain shortness of breath or palpitations.  He does have abrasion to right posterior aspect of head.  Bleeding is controlled.  Right now he feels back to baseline and is ambulatory with steady gait.    Loss of Consciousness      Prior to Admission medications   Medication Sig Start Date End Date Taking? Authorizing Provider  hydrocortisone  (ANUSOL -HC) 2.5 % rectal cream Place 1 Application rectally 2 (two) times daily. 01/23/23   Craig Alan SAUNDERS, PA-C  ibuprofen (ADVIL) 200 MG tablet Take 200 mg by mouth every 6 (six) hours as needed.    [provider]  pantoprazole  (PROTONIX ) 40 MG tablet Take 1 tablet (40 mg total) by mouth daily. 01/23/23   Craig Alan SAUNDERS, PA-C  Rectal Protectant-Emollient (CALMOL-4) 76-10 % SUPP Place 1 suppository rectally every 12 (twelve) hours as needed. 01/29/23   Armbruster, Elspeth SQUIBB, MD    Allergies: Patient has no known allergies.    Review of Systems  Cardiovascular:  Positive for syncope.  Skin:  Positive for wound.    Updated Vital Signs BP  119/85 (BP Location: Right Arm)   Pulse (!) 106   Temp 98 F (36.7 C)   Resp 16   Ht 5' 6 (1.676 m)   Wt 104.3 kg   SpO2 100%   BMI 37.11 kg/m   Physical Exam Vitals and nursing note reviewed.  Constitutional:      General: He is not in acute distress.    Appearance: He is not toxic-appearing.  HENT:     Head: Normocephalic and atraumatic.  Eyes:     General: No scleral icterus.    Conjunctiva/sclera: Conjunctivae normal.  Cardiovascular:     Rate and Rhythm: Normal rate and regular rhythm.     Pulses: Normal pulses.     Heart sounds: Normal heart sounds.  Pulmonary:     Effort: Pulmonary effort is normal. No respiratory distress.     Breath sounds: Normal breath sounds.  Abdominal:     General: Abdomen is flat. Bowel sounds are normal.     Palpations: Abdomen is soft.     Tenderness: There is no abdominal tenderness.  Skin:    General: Skin is warm and dry.     Findings: No lesion.  Neurological:     General: No focal deficit present.     Mental Status: He is alert and oriented to person, place, and time. Mental status is at baseline.     (all labs ordered are listed, but only abnormal  results are displayed) Labs Reviewed  BASIC METABOLIC PANEL WITH GFR - Abnormal; Notable for the following components:      Result Value   Glucose, Bld 109 (*)    All other components within normal limits  CBC - Abnormal; Notable for the following components:   WBC 12.6 (*)    MCH 25.7 (*)    Platelets 408 (*)    All other components within normal limits  D-DIMER, QUANTITATIVE  CK  TROPONIN I (HIGH SENSITIVITY)  TROPONIN I (HIGH SENSITIVITY)    EKG: None  Radiology: CT Cervical Spine Wo Contrast Result Date: 10/28/2023 CLINICAL DATA:  Polytrauma, blunt EXAM: CT CERVICAL SPINE WITHOUT CONTRAST TECHNIQUE: Multidetector CT imaging of the cervical spine was performed without intravenous contrast. Multiplanar CT image reconstructions were also generated. RADIATION DOSE  REDUCTION: This exam was performed according to the departmental dose-optimization program which includes automated exposure control, adjustment of the mA and/or kV according to patient size and/or use of iterative reconstruction technique. COMPARISON:  None Available. FINDINGS: Alignment: No subluxation Skull base and vertebrae: No acute fracture. No primary bone lesion or focal pathologic process. Soft tissues and spinal canal: No prevertebral fluid or swelling. No visible canal hematoma. Disc levels: Anterior spurring at C4-5 and C5-6. No visible focal disc herniation. Upper chest: No acute findings Other: None IMPRESSION: No acute bony abnormality. Electronically Signed   By: Franky Crease M.D.   On: 10/28/2023 20:00   CT Head Wo Contrast Result Date: 10/28/2023 CLINICAL DATA:  Head trauma, moderate-severe.  Loss of consciousness EXAM: CT HEAD WITHOUT CONTRAST TECHNIQUE: Contiguous axial images were obtained from the base of the skull through the vertex without intravenous contrast. RADIATION DOSE REDUCTION: This exam was performed according to the departmental dose-optimization program which includes automated exposure control, adjustment of the mA and/or kV according to patient size and/or use of iterative reconstruction technique. COMPARISON:  None Available. FINDINGS: Brain: No acute intracranial abnormality. Specifically, no hemorrhage, hydrocephalus, mass lesion, acute infarction, or significant intracranial injury. Vascular: No hyperdense vessel or unexpected calcification. Skull: No acute calvarial abnormality. Sinuses/Orbits: No acute findings Other: None IMPRESSION: Normal study. Electronically Signed   By: Franky Crease M.D.   On: 10/28/2023 19:58   DG Chest 2 View Result Date: 10/28/2023 CLINICAL DATA:  Syncope EXAM: CHEST - 2 VIEW COMPARISON:  Chest x-ray March 22, 2012 FINDINGS: The heart size and mediastinal contours are within normal limits. Both lungs are clear. The visualized skeletal  structures are unremarkable. 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 No definite fracture identified. IMPRESSION: No active cardiopulmonary disease.  No osseous fracture identified. Electronically Signed   By: Megan  Zare M.D.   On: 10/28/2023 19:50     Procedures   Medications Ordered in the ED  Tdap (BOOSTRIX ) injection 0.5 mL (0.5 mLs Intramuscular Given 10/28/23 2355)  sodium chloride  0.9 % bolus 500 mL (500 mLs Intravenous New Bag/Given 10/28/23 2356)                                    Medical Decision Making Amount and/or Complexity of Data Reviewed Labs: ordered.   This patient presents to the ED for concern of syncope, this involves an extensive number of treatment options, and is a complaint that carries with it a high risk of complications and morbidity.  The differential diagnosis includes seizure, vasovagal syncope, heat exhaustion, pulmonary embolism, sepsis   Co morbidities  that complicate the patient evaluation  Noncontributory    Lab Tests:  I personally interpreted labs.  The pertinent results include:   CBC with mild leukocytosis.  Feels most likely reactive.  No significant anemia.  BMP without electrolyte abnormality.  Normal kidney function.  CK is mildly elevated at 743, but has been producing normal amount of urine and has normal kidney function. Troponin x 2 negative D-dimer negative   Imaging Studies ordered:  I ordered imaging studies including CT scan of cervical spine, CT scan of head and chest x-ray I independently visualized and interpreted imaging which showed no acute findings I agree with the radiologist interpretation   Cardiac Monitoring: / EKG:  The patient was maintained on a cardiac monitor.  I personally viewed and interpreted the cardiac monitored which showed an underlying rhythm of: Normal sinus   Problem List / ED Course / Critical interventions / Medication management  Patient presents after syncope and collapse.   Preceding this he did not have chest pain shortness of breath or palpitations.  He did lose consciousness for several minutes.  This was unwitnessed but recorded on ring camera.  After syncopal episode he regained consciousness and stood up and passed out again x 2.  He felt quite poorly for several minutes after but has now returned to baseline.  Prior to coming into ED had returned to baseline.  EKG here shows no arrhythmia.  Imaging without acute findings.  Labs are overall reassuring with normal kidney function.  His CK is elevated.  He has received normal saline here and he is tolerating oral intake. Dimer negative. Seems most consistent with heat exhaustion leading to syncope.  He will follow-up with primary care to have further syncope workup.  Given his improvement in symptoms, no sign of systemic illness and negative orthostatic vital signs with no comorbidities feel he is appropriate for discharge and outpatient follow-up for this. Did have small abrasion to posterior aspect of head.  This will not require laceration repair.  Clean with gentle soap and water.  Tetanus shot was updated. Negative orthostatic vitals I ordered medication including tdap, NS Reevaluation of the patient after these medicines showed that the patient improved I have reviewed the patients home medicines and have made adjustments as needed   Plan F/u w/ PCP in 2-3d to ensure resolution of sx.  Patient was given return precautions. Patient stable for discharge at this time.  Patient educated on sx/dx and verbalized understanding of plan. Return to ER w/ new or worsening sx.       Final diagnoses:  Syncope and collapse    ED Discharge Orders     None          Shermon, Warren SAILOR, PA-C 10/29/23 0200    Griselda Norris, MD 10/29/23 [phone]

## 2023-10-29 NOTE — Discharge Instructions (Addendum)
 Follow-up with your primary care doctor.  You should have a syncope workup done.  Return to ER with new or worsening symptoms.  I suspect your syncope was secondary to heat exhaustion.  Make sure you are staying well-hydrated and electrolyte drinks and eating regular meals, take frequent breaks.  If you feel like you are going to pass out please or self slowly to the ground.  If you have decreased urine output or your urine starts looking dark please come back to emergency room.

## 2023-12-30 ENCOUNTER — Encounter (HOSPITAL_BASED_OUTPATIENT_CLINIC_OR_DEPARTMENT_OTHER): Payer: Self-pay | Admitting: Emergency Medicine

## 2023-12-30 ENCOUNTER — Emergency Department (HOSPITAL_BASED_OUTPATIENT_CLINIC_OR_DEPARTMENT_OTHER): Payer: Self-pay

## 2023-12-30 ENCOUNTER — Emergency Department (HOSPITAL_BASED_OUTPATIENT_CLINIC_OR_DEPARTMENT_OTHER)
Admission: EM | Admit: 2023-12-30 | Discharge: 2023-12-30 | Disposition: A | Discharge status: Home / Self Care | Payer: Self-pay | Attending: Emergency Medicine | Admitting: Emergency Medicine

## 2023-12-30 ENCOUNTER — Other Ambulatory Visit: Payer: Self-pay

## 2023-12-30 DIAGNOSIS — J019 Acute sinusitis, unspecified: Secondary | ICD-10-CM | POA: Insufficient documentation

## 2023-12-30 LAB — RESP PANEL BY RT-PCR (RSV, FLU A&B, COVID)  RVPGX2
Influenza A by PCR: NEGATIVE
Influenza B by PCR: NEGATIVE
Resp Syncytial Virus by PCR: NEGATIVE
SARS Coronavirus 2 by RT PCR: NEGATIVE

## 2023-12-30 MED ORDER — PROCHLORPERAZINE EDISYLATE 10 MG/2ML IJ SOLN
5.0000 mg | Freq: Once | INTRAMUSCULAR | Status: AC
  Administered 2023-12-30: 5 mg via INTRAMUSCULAR
  Filled 2023-12-30: qty 2

## 2023-12-30 MED ORDER — KETOROLAC TROMETHAMINE 15 MG/ML IJ SOLN
15.0000 mg | Freq: Once | INTRAMUSCULAR | Status: AC
  Administered 2023-12-30: 15 mg via INTRAMUSCULAR
  Filled 2023-12-30: qty 1

## 2023-12-30 NOTE — ED Provider Notes (Signed)
 [place] EMERGENCY DEPARTMENT AT [facility] Provider Note   CSN: [id number] Arrival date & time: 12/30/23  9297     Patient presents with: Headache   Curtis Santiago is a 53 y.o. male.    Headache Patient with headache.  Left side head.  Stabbing.  Does not tend to get headaches.  No numbness or weakness.  No vision changes.  Does maybe have some sinus congestion on the left side.    Past Medical History:  Diagnosis Date   Gallstones     Prior to Admission medications   Medication Sig Start Date End Date Taking? Authorizing Provider  hydrocortisone  (ANUSOL -HC) 2.5 % rectal cream Place 1 Application rectally 2 (two) times daily. 01/23/23   Craig Alan SAUNDERS, PA-C  ibuprofen (ADVIL) 200 MG tablet Take 200 mg by mouth every 6 (six) hours as needed.    [provider]  pantoprazole  (PROTONIX ) 40 MG tablet Take 1 tablet (40 mg total) by mouth daily. 01/23/23   Craig Alan SAUNDERS, PA-C  Rectal Protectant-Emollient (CALMOL-4) 76-10 % SUPP Place 1 suppository rectally every 12 (twelve) hours as needed. 01/29/23   Armbruster, Elspeth SQUIBB, MD    Allergies: Patient has no known allergies.    Review of Systems  Neurological:  Positive for headaches.    Updated Vital Signs BP 121/78 (BP Location: Left Arm)   Pulse 62   Temp 99.1 F (37.3 C) (Oral)   Resp 14   Ht 5' 6 (1.676 m)   Wt 91.1 kg   SpO2 98%   BMI 32.42 kg/m   Physical Exam Vitals and nursing note reviewed.  HENT:     Head:     Comments: No facial tenderness.  No rash.  Eye movements intact. Eyes:     Pupils: Pupils are equal, round, and reactive to light.  Cardiovascular:     Rate and Rhythm: Regular rhythm.  Skin:    General: Skin is warm.  Neurological:     Mental Status: He is alert.     (all labs ordered are listed, but only abnormal results are displayed) Labs Reviewed  RESP PANEL BY RT-PCR (RSV, FLU A&B, COVID)  [id number]    EKG: None  Radiology: CT Head Wo Contrast Result  Date: 12/30/2023 EXAM: CT HEAD WITHOUT CONTRAST 12/30/2023 07:49:57 AM TECHNIQUE: CT of the head was performed without the administration of intravenous contrast. Automated exposure control, iterative reconstruction, and/or weight based adjustment of the mA/kV was utilized to reduce the radiation dose to as low as reasonably achievable. COMPARISON: 10/28/2023 CLINICAL HISTORY: Headache, increasing frequency or severity. Patient is alert and oriented times four, ambulatory, and in no acute distress. Complains of headache on the right side described as stabbing for 1 week, denies cough, shortness of breath, fever but has had some congestion. FINDINGS: BRAIN AND VENTRICLES: No acute hemorrhage. No evidence of acute infarct. No hydrocephalus. No extra-axial collection. No mass effect or midline shift. ORBITS: No acute abnormality. SINUSES: There is partial opacification of the ethmoid air cells and mild mucosal thickening involving the maxillary sinuses and sphenoid sinus. SOFT TISSUES AND SKULL: No acute soft tissue abnormality. No skull fracture. IMPRESSION: 1. No acute intracranial abnormality. 2. Partial opacification of the ethmoid air cells and mild mucosal thickening involving the maxillary sinuses and sphenoid sinus. Electronically signed by: Waddell Calk MD 12/30/2023 08:05 AM EDT RP Workstation: [id number]     Procedures   Medications Ordered in the ED  ketorolac  (TORADOL ) 15 MG/ML injection 15 mg (15 mg  Intramuscular Given 12/30/23 0754)  prochlorperazine  (COMPAZINE ) injection 5 mg (5 mg Intramuscular Given 12/30/23 0755)                                    Medical Decision Making Amount and/or Complexity of Data Reviewed Radiology: ordered.  Risk Prescription drug management.   Patient with headache.  Left-sided.  No rash.  Gradual.  No real red flags.  Differential diagnose includes various causes of headache including nonspecific headache and sinusitis.  Since does not tend to get  headaches will get CT scan.  Will treat symptomatically.   Reviewed previous blood work and previous ER note.  Workup reassuring.  Head CT negative for intracranial bleeding but does have potential sinusitis.  Appears stable for discharge home.  Do not think we need sinusitis treatment at this time as it is only been a few days with the symptoms.     Final diagnoses:  Acute non-recurrent sinusitis, unspecified location    ED Discharge Orders     None          Patsey Lot, MD 12/30/23 1422

## 2023-12-30 NOTE — Discharge Instructions (Signed)
 It looks her sinuses are irritated.  Over-the-counter decongestants may help.  Follow-up with your doctor as needed.

## 2023-12-30 NOTE — ED Triage Notes (Signed)
 Pt caox4, ambulatory NAD c/o headache on the R side described as stabbing x1 wk, denies cough, SOB, fever but has had some congestion.

## 2024-01-06 ENCOUNTER — Ambulatory Visit (INDEPENDENT_AMBULATORY_CARE_PROVIDER_SITE_OTHER): Payer: Self-pay | Admitting: Nurse Practitioner

## 2024-01-06 ENCOUNTER — Encounter: Payer: Self-pay | Admitting: Nurse Practitioner

## 2024-01-06 VITALS — BP 121/74 | HR 69 | Temp 97.5°F | Ht 69.0 in | Wt 200.0 lb

## 2024-01-06 DIAGNOSIS — Z122 Encounter for screening for malignant neoplasm of respiratory organs: Secondary | ICD-10-CM | POA: Insufficient documentation

## 2024-01-06 DIAGNOSIS — R519 Headache, unspecified: Secondary | ICD-10-CM | POA: Insufficient documentation

## 2024-01-06 DIAGNOSIS — Z1211 Encounter for screening for malignant neoplasm of colon: Secondary | ICD-10-CM | POA: Insufficient documentation

## 2024-01-06 DIAGNOSIS — F17200 Nicotine dependence, unspecified, uncomplicated: Secondary | ICD-10-CM | POA: Insufficient documentation

## 2024-01-06 DIAGNOSIS — R634 Abnormal weight loss: Secondary | ICD-10-CM | POA: Insufficient documentation

## 2024-01-06 DIAGNOSIS — Z125 Encounter for screening for malignant neoplasm of prostate: Secondary | ICD-10-CM | POA: Insufficient documentation

## 2024-01-06 DIAGNOSIS — Z136 Encounter for screening for cardiovascular disorders: Secondary | ICD-10-CM | POA: Insufficient documentation

## 2024-01-06 DIAGNOSIS — R2681 Unsteadiness on feet: Secondary | ICD-10-CM | POA: Insufficient documentation

## 2024-01-06 DIAGNOSIS — J019 Acute sinusitis, unspecified: Secondary | ICD-10-CM | POA: Insufficient documentation

## 2024-01-06 DIAGNOSIS — R739 Hyperglycemia, unspecified: Secondary | ICD-10-CM | POA: Insufficient documentation

## 2024-01-06 LAB — POCT GLYCOSYLATED HEMOGLOBIN (HGB A1C): Hemoglobin A1C: 5.5 % (ref 4.0–5.6)

## 2024-01-06 MED ORDER — AMOXICILLIN-POT CLAVULANATE 875-125 MG PO TABS
1.0000 | ORAL_TABLET | Freq: Two times a day (BID) | ORAL | 0 refills | Status: AC
Start: 2024-01-06 — End: ?

## 2024-01-06 NOTE — Assessment & Plan Note (Signed)
 Wt Readings from Last 3 Encounters:  01/06/24 200 lb (90.7 kg)  12/30/23 200 lb 13.4 oz (91.1 kg)  10/28/23 229 lb 15 oz (104.3 kg)   Body mass index is 29.53 kg/m.  Unintentional weight loss Unintentional weight loss with decreased appetite, previous gastroenterology evaluation unrelated. - Order labs to investigate potential causes of weight loss. - Consider referral to gastroenterology if labs are inconclusive.

## 2024-01-06 NOTE — Assessment & Plan Note (Signed)
 Unsteady gait Intermittent unsteady gait with no recent falls, balance issues persist, previous syncope due to hypoglycemia, does not have history of diabetes A1c normal in the office today - Order labs to check vitamin B12 levels. - Refer to neurology for further evaluation of balance issues.

## 2024-01-06 NOTE — Assessment & Plan Note (Signed)
 Acute left-sided headache and acute sinusitis Persistent headache for two weeks, likely sinusitis-related due to recent cold and CT findings of mild sinus thickening. - Prescribe antibiotics for acute sinusitis. - Advise to report if headache persists after antibiotics. - Consider referral to neurology if symptoms persist.

## 2024-01-06 NOTE — Assessment & Plan Note (Signed)
 Smokes about less than 0.5 pack/day  Asked about quitting: confirms that he currently smokes cigarettes Advise to quit smoking: Educated about QUITTING to reduce the risk of cancer, cardio and cerebrovascular disease. Assess willingness: Unwilling to quit at this time, but is working on cutting back. Assist with counseling and pharmacotherapy: Counseled for 5 minutes and literature provided. Arrange for follow up: follow up in 1 months and continue to offer help.

## 2024-01-06 NOTE — Patient Instructions (Signed)
 1. Screening for lung cancer (Primary)  - CT CHEST LUNG CA SCREEN LOW DOSE W/O CM; Future  2. Screening for prostate cancer  - PSA; Future  3. Unsteady gait  - B12 and Folate Panel; Future  4. Unintended weight loss  - CBC; Future - TSH; Future - VITAMIN D 25 Hydroxy (Vit-D Deficiency, Fractures); Future - HepB+HepC+HIV Panel; Future - CMP14+EGFR; Future  5. Acute sinusitis, recurrence not specified, unspecified location  - amoxicillin -clavulanate (AUGMENTIN ) 875-125 MG tablet; Take 1 tablet by mouth 2 (two) times daily.  Dispense: 20 tablet; Refill: 0 .  Okay to take Tylenol  650 mg every 6 hours as needed for headaches I encourage to 64 ounces of water daily to maintain hydration  It is important that you exercise regularly at least 30 minutes 5 times a week as tolerated  Think about what you will eat, plan ahead. Choose  clean, green, fresh or frozen over canned, processed or packaged foods which are more sugary, salty and fatty. 70 to 75% of food eaten should be vegetables and fruit. Three meals at set times with snacks allowed between meals, but they must be fruit or vegetables. Aim to eat over a 12 hour period , example 7 am to 7 pm, and STOP after  your last meal of the day. Drink water,generally about 64 ounces per day, no other drink is as healthy. Fruit juice is best enjoyed in a healthy way, by EATING the fruit.  Thanks for choosing [facility] we consider it a privelige to serve you.

## 2024-01-06 NOTE — Progress Notes (Signed)
 New Patient Office Visit  Subjective:  Patient ID: Curtis Santiago, male    DOB: 10-05-1970  Age: 53 y.o. MRN: [id number]  CC:  Chief Complaint  Patient presents with   Headache   Weight Loss   abnormal gait    HPI   Discussed the use of AI scribe software for clinical note transcription with the patient, who gave verbal consent to proceed.  History of Present Illness Cathan Gearin is a 53 year old male who presents to establish care and with complaints of with persistent left-sided headaches and balance issues.  He has been experiencing left-sided headaches for the past two weeks, occurring daily. Initially, he thought they were related to sinus issues. The headaches are present at the time of the visit but are less severe than before. A CT scan was performed about a month ago, and the patient recalls being told there was no acute abnormality, but there was mention of mild thickening of the sinuses. During an emergency room visit last week, he was treated for acute nonrecurrent sinusitis with a Toradol  shot and Compazine , but no antibiotics were prescribed. No nausea, vomiting, changes in vision, numbness, or tingling are reported. He has a history of occasional headaches related to toothaches.  He reports balance issues that have been present since he turned 52, about a year ago. He feels off balance when getting up in the morning or while walking. He passed out once while cutting grass in July, attributing it to low blood sugar after not eating. No falls have occurred since then, but his wife has noticed his balance issues. No numbness or tingling.  He has experienced unintentional weight loss over the past year, with a decrease in waist size from 44 to 38 inches. He attributes this to a decreased appetite, stating 'my appetite isn't like it used to be.' He saw a gastroenterologist in December for swallowing issues, which have since resolved.Up-to-date with colon cancer screening which  was normal with recommendation to repeat in 10 years  He smokes about two packs of cigarettes a week, having reduced from a pack a day. He started smoking in high school. He drinks alcohol occasionally and denies drug use. He lives with his wife and has no children. His insurance has not yet kicked in.     Assessment & Plan     Past Medical History:  Diagnosis Date   Gallstones     Past Surgical History:  Procedure Laterality Date   gallstone removal      History reviewed. No pertinent family history.  Social History   Socioeconomic History   Marital status: Married    Spouse name: Not on file   Number of children: Not on file   Years of education: Not on file   Highest education level: Not on file  Occupational History   Not on file  Tobacco Use   Smoking status: Former    Types: Cigarettes   Smokeless tobacco: Former  Advertising account planner   Vaping status: Never Used  Substance and Sexual Activity   Alcohol use: Yes    Comment: occasional   Drug use: No   Sexual activity: Yes  Other Topics Concern   Not on file  Social History Narrative   Lives with his wife    Social Drivers of Corporate investment banker Strain: Not on file  Food Insecurity: Not on file  Transportation Needs: Not on file  Physical Activity: Not on file  Stress: Not on file  Social Connections: Unknown (09/03/2021)   Received from [facility]   Social Network    Social Network: Not on file  Intimate Partner Violence: Unknown (07/26/2021)   Received from [facility]   HITS    Physically Hurt: Not on file    Insult or Talk Down To: Not on file    Threaten Physical Harm: Not on file    Scream or Curse: Not on file    ROS Review of Systems  Constitutional:  Positive for unexpected weight change. Negative for appetite change, chills, fatigue and fever.  HENT:  Negative for congestion, postnasal drip, rhinorrhea and sneezing.   Respiratory:  Negative for cough, shortness of breath and  wheezing.   Cardiovascular:  Negative for chest pain, palpitations and leg swelling.  Gastrointestinal:  Negative for abdominal pain, constipation, nausea and vomiting.  Genitourinary:  Negative for difficulty urinating, dysuria, flank pain and frequency.  Musculoskeletal:  Positive for gait problem. Negative for arthralgias, back pain, joint swelling and myalgias.  Skin:  Negative for color change, pallor, rash and wound.  Neurological:  Positive for headaches. Negative for dizziness, facial asymmetry, weakness and numbness.  Psychiatric/Behavioral:  Negative for behavioral problems, confusion, self-injury and suicidal ideas.     Objective:   Today's Vitals: BP 121/74   Pulse 69   Temp (!) 97.5 F (36.4 C)   Ht 5' 9 (1.753 m)   Wt 200 lb (90.7 kg)   SpO2 100%   BMI 29.53 kg/m   Physical Exam Vitals and nursing note reviewed.  Constitutional:      General: He is not in acute distress.    Appearance: Normal appearance. He is not ill-appearing, toxic-appearing or diaphoretic.  HENT:     Mouth/Throat:     Mouth: Mucous membranes are moist.     Pharynx: Oropharynx is clear. No oropharyngeal exudate or posterior oropharyngeal erythema.  Eyes:     General: No scleral icterus.       Right eye: No discharge.        Left eye: No discharge.     Extraocular Movements: Extraocular movements intact.     Conjunctiva/sclera: Conjunctivae normal.  Cardiovascular:     Rate and Rhythm: Normal rate and regular rhythm.     Pulses: Normal pulses.     Heart sounds: Normal heart sounds. No murmur heard.    No friction rub. No gallop.  Pulmonary:     Effort: Pulmonary effort is normal. No respiratory distress.     Breath sounds: Normal breath sounds. No stridor. No wheezing, rhonchi or rales.  Chest:     Chest wall: No tenderness.  Abdominal:     General: There is no distension.     Palpations: Abdomen is soft.     Tenderness: There is no abdominal tenderness. There is no right CVA  tenderness, left CVA tenderness or guarding.  Musculoskeletal:        General: No swelling, tenderness, deformity or signs of injury.     Right lower leg: No edema.     Left lower leg: No edema.  Skin:    General: Skin is warm and dry.     Capillary Refill: Capillary refill takes less than 2 seconds.     Coloration: Skin is not jaundiced or pale.     Findings: No bruising, erythema or lesion.  Neurological:     Mental Status: He is alert and oriented to person, place, and time.     GCS: GCS eye subscore is 4.  GCS verbal subscore is 5. GCS motor subscore is 6.     Motor: No weakness.     Coordination: Coordination normal.     Gait: Gait normal.  Psychiatric:        Mood and Affect: Mood normal.        Behavior: Behavior normal.        Thought Content: Thought content normal.        Judgment: Judgment normal.     Assessment & Plan:   Problem List Items Addressed This Visit       Respiratory   Acute sinusitis - Primary   Acute left-sided headache and acute sinusitis Persistent headache for two weeks, likely sinusitis-related due to recent cold and CT findings of mild sinus thickening. - Prescribe antibiotics for acute sinusitis. - Advise to report if headache persists after antibiotics. - Consider referral to neurology if symptoms persist.       Relevant Medications   amoxicillin -clavulanate (AUGMENTIN ) 875-125 MG tablet     Other   Hyperglycemia   Relevant Orders   POCT glycosylated hemoglobin (Hb A1C) (Completed)   Unsteady gait   Unsteady gait Intermittent unsteady gait with no recent falls, balance issues persist, previous syncope due to hypoglycemia, does not have history of diabetes A1c normal in the office today - Order labs to check vitamin B12 levels. - Refer to neurology for further evaluation of balance issues.       Relevant Orders   B12 and Folate Panel   Ambulatory referral to Neurology   Screening for prostate cancer   Relevant Orders   PSA    Screening for lung cancer   Relevant Orders   CT CHEST LUNG CA SCREEN LOW DOSE W/O CM   Acute nonintractable headache   Relevant Orders   Ambulatory referral to Neurology   Unintended weight loss   Wt Readings from Last 3 Encounters:  01/06/24 200 lb (90.7 kg)  12/30/23 200 lb 13.4 oz (91.1 kg)  10/28/23 229 lb 15 oz (104.3 kg)   Body mass index is 29.53 kg/m.  Unintentional weight loss Unintentional weight loss with decreased appetite, previous gastroenterology evaluation unrelated. - Order labs to investigate potential causes of weight loss. - Consider referral to gastroenterology if labs are inconclusive.       Relevant Orders   CBC   TSH   VITAMIN D 25 Hydroxy (Vit-D Deficiency, Fractures)   HepB+HepC+HIV Panel   CMP14+EGFR   Encounter for screening for cardiovascular disorders   Relevant Orders   Lipid panel   Screening for colon cancer   Tobacco use disorder   Smokes about less than 0.5 pack/day  Asked about quitting: confirms that he currently smokes cigarettes Advise to quit smoking: Educated about QUITTING to reduce the risk of cancer, cardio and cerebrovascular disease. Assess willingness: Unwilling to quit at this time, but is working on cutting back. Assist with counseling and pharmacotherapy: Counseled for 5 minutes and literature provided. Arrange for follow up: follow up in 1 months and continue to offer help.         Outpatient Encounter Medications as of 01/06/2024  Medication Sig   amoxicillin -clavulanate (AUGMENTIN ) 875-125 MG tablet Take 1 tablet by mouth 2 (two) times daily.   ibuprofen (ADVIL) 200 MG tablet Take 200 mg by mouth every 6 (six) hours as needed.   hydrocortisone  (ANUSOL -HC) 2.5 % rectal cream Place 1 Application rectally 2 (two) times daily. (Patient not taking: Reported on 01/06/2024)   pantoprazole  (PROTONIX ) 40 MG tablet  Take 1 tablet (40 mg total) by mouth daily. (Patient not taking: Reported on 01/06/2024)   Rectal  Protectant-Emollient (CALMOL-4) 76-10 % SUPP Place 1 suppository rectally every 12 (twelve) hours as needed. (Patient not taking: Reported on 01/06/2024)   No facility-administered encounter medications on file as of 01/06/2024.    Follow-up: Return in about 4 weeks (around 02/03/2024), or Headache.   Jeliyah Middlebrooks R Lasonja Lakins, FNP

## 2024-01-11 ENCOUNTER — Other Ambulatory Visit: Payer: Self-pay

## 2024-01-11 DIAGNOSIS — R634 Abnormal weight loss: Secondary | ICD-10-CM

## 2024-01-11 DIAGNOSIS — Z125 Encounter for screening for malignant neoplasm of prostate: Secondary | ICD-10-CM

## 2024-01-11 DIAGNOSIS — R2681 Unsteadiness on feet: Secondary | ICD-10-CM

## 2024-01-11 DIAGNOSIS — Z136 Encounter for screening for cardiovascular disorders: Secondary | ICD-10-CM

## 2024-01-12 ENCOUNTER — Ambulatory Visit: Payer: Self-pay | Admitting: Nurse Practitioner

## 2024-01-12 DIAGNOSIS — D649 Anemia, unspecified: Secondary | ICD-10-CM

## 2024-01-12 LAB — CMP14+EGFR
ALT: 20 IU/L (ref 0–44)
AST: 14 IU/L (ref 0–40)
Albumin: 4.1 g/dL (ref 3.8–4.9)
Alkaline Phosphatase: 89 IU/L (ref 47–123)
BUN/Creatinine Ratio: 17 (ref 9–20)
BUN: 16 mg/dL (ref 6–24)
Bilirubin Total: <0.2 mg/dL (ref 0.0–1.2)
CO2: 26 mmol/L (ref 20–29)
Calcium: 9.4 mg/dL (ref 8.7–10.2)
Chloride: 102 mmol/L (ref 96–106)
Creatinine, Ser: 0.95 mg/dL (ref 0.76–1.27)
Globulin, Total: 2.5 g/dL (ref 1.5–4.5)
Glucose: 98 mg/dL (ref 70–99)
Potassium: 3.8 mmol/L (ref 3.5–5.2)
Sodium: 141 mmol/L (ref 134–144)
Total Protein: 6.6 g/dL (ref 6.0–8.5)
eGFR: 97 mL/min/1.73 (ref >59)

## 2024-01-12 LAB — LIPID PANEL
Chol/HDL Ratio: 4.1 ratio (ref 0.0–5.0)
Cholesterol, Total: 123 mg/dL (ref 100–199)
HDL: 30 mg/dL — ABNORMAL LOW (ref >39)
LDL Chol Calc (NIH): 72 mg/dL (ref 0–99)
Triglycerides: 111 mg/dL (ref 0–149)
VLDL Cholesterol Cal: 21 mg/dL (ref 5–40)

## 2024-01-12 LAB — HEPB+HEPC+HIV PANEL
HIV Screen 4th Generation wRfx: NONREACTIVE
Hep B C IgM: NEGATIVE
Hep B Core Total Ab: NEGATIVE
Hep B E Ab: NONREACTIVE
Hep B E Ag: NEGATIVE
Hep B Surface Ab, Qual: NONREACTIVE
Hep C Virus Ab: NONREACTIVE
Hepatitis B Surface Ag: NEGATIVE

## 2024-01-12 LAB — CBC
Hematocrit: 41 % (ref 37.5–51.0)
Hemoglobin: 12.4 g/dL — ABNORMAL LOW (ref 13.0–17.7)
MCH: 25.2 pg — ABNORMAL LOW (ref 26.6–33.0)
MCHC: 30.2 g/dL — ABNORMAL LOW (ref 31.5–35.7)
MCV: 83 fL (ref 79–97)
Platelets: 387 x10E3/uL (ref 150–450)
RBC: 4.93 x10E6/uL (ref 4.14–5.80)
RDW: 14.2 % (ref 11.6–15.4)
WBC: 8.1 x10E3/uL (ref 3.4–10.8)

## 2024-01-12 LAB — VITAMIN D 25 HYDROXY (VIT D DEFICIENCY, FRACTURES): Vit D, 25-Hydroxy: 12.7 ng/mL — ABNORMAL LOW (ref 30.0–100.0)

## 2024-01-12 LAB — TSH: TSH: 0.92 u[IU]/mL (ref 0.450–4.500)

## 2024-01-12 LAB — B12 AND FOLATE PANEL
Folate: 18.3 ng/mL (ref >3.0)
Vitamin B-12: 445 pg/mL (ref 232–1245)

## 2024-01-12 LAB — PSA: Prostate Specific Ag, Serum: 1.9 ng/mL (ref 0.0–4.0)

## 2024-01-14 LAB — IRON,TIBC AND FERRITIN PANEL
Ferritin: 255 ng/mL (ref 30–400)
Iron Saturation: 19 % (ref 15–55)
Iron: 55 ug/dL (ref 38–169)
Total Iron Binding Capacity: 292 ug/dL (ref 250–450)
UIBC: 237 ug/dL (ref 111–343)

## 2024-01-14 LAB — SPECIMEN STATUS REPORT

## 2024-02-05 ENCOUNTER — Ambulatory Visit: Payer: Self-pay | Admitting: Nurse Practitioner
